# Patient Record
Sex: Female | Born: 1990
Health system: Southern US, Community
[De-identification: ages and names within clinical notes are randomized; demographics above are authoritative.]

## PROBLEM LIST (undated history)

## (undated) ENCOUNTER — Emergency Department (HOSPITAL_COMMUNITY): Discharge status: Absent without leave | Payer: BLUE CROSS/BLUE SHIELD

## (undated) DIAGNOSIS — Z3490 Encounter for supervision of normal pregnancy, unspecified, unspecified trimester: Secondary | ICD-10-CM

## (undated) DIAGNOSIS — O139 Gestational [pregnancy-induced] hypertension without significant proteinuria, unspecified trimester: Secondary | ICD-10-CM

## (undated) HISTORY — DX: Gestational (pregnancy-induced) hypertension without significant proteinuria, unspecified trimester: O13.9

## (undated) HISTORY — PX: NO PAST SURGERIES: SHX2092

---
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
## 2012-06-17 ENCOUNTER — Encounter (HOSPITAL_COMMUNITY): Payer: Self-pay | Admitting: *Deleted

## 2012-06-17 ENCOUNTER — Emergency Department (HOSPITAL_COMMUNITY)
Admission: EM | Admit: 2012-06-17 | Discharge: 2012-06-18 | Disposition: A | Discharge status: Home / Self Care | Payer: BC Managed Care – PPO | Attending: Emergency Medicine | Admitting: Emergency Medicine

## 2012-06-17 DIAGNOSIS — R21 Rash and other nonspecific skin eruption: Secondary | ICD-10-CM | POA: Insufficient documentation

## 2012-06-17 NOTE — ED Notes (Signed)
Rash to rt upper medial thigh.

## 2012-06-17 NOTE — ED Notes (Signed)
No answer

## 2012-06-18 MED ORDER — MUPIROCIN CALCIUM 2 % NA OINT: TOPICAL_OINTMENT | NASAL | Status: DC

## 2012-06-18 NOTE — ED Provider Notes (Signed)
History     CSN: [id number]  Arrival date & time 06/17/12  2159   First MD Initiated Contact with Patient 06/17/12 2354      Chief Complaint  Patient presents with  . Rash    (Consider location/radiation/quality/duration/timing/severity/associated sxs/prior treatment) HPI Comments: Patient states she first noticed a small abscess on the right upper inner thigh. This ruptured and shortly after this a old rash with small raised bumps in it appeared. The rash is uncomfortable with clothes rubbing against it. There is no weeping of this rash. The patient became concerned of possible infection and presents to the emergency department for evaluation. There's been no fever.  Patient is a 21 y.o. female presenting with rash. The history is provided by the patient.  Rash  This is a new problem. The current episode started more than 2 days ago. The problem has been gradually worsening. Associated with: unknown. There has been no fever. The rash is present on the right upper leg (right inner thigh). The pain is mild. The pain has been intermittent since onset. Associated symptoms include pain. She has tried nothing for the symptoms.    History reviewed. No pertinent past medical history.  History reviewed. No pertinent past surgical history.  History reviewed. No pertinent family history.  History  Substance Use Topics  . Smoking status: Never Smoker   . Smokeless tobacco: Not on file  . Alcohol Use: No    OB History    Grav Para Term Preterm Abortions TAB SAB Ect Mult Living                  Review of Systems  Constitutional: Negative for activity change.       All ROS Neg except as noted in HPI  HENT: Negative for nosebleeds and neck pain.   Eyes: Negative for photophobia and discharge.  Respiratory: Negative for cough, shortness of breath and wheezing.   Cardiovascular: Negative for chest pain and palpitations.  Gastrointestinal: Negative for abdominal pain and blood in  stool.  Genitourinary: Negative for dysuria, frequency and hematuria.  Musculoskeletal: Negative for back pain and arthralgias.  Skin: Positive for rash.  Neurological: Negative for dizziness, seizures and speech difficulty.  Psychiatric/Behavioral: Negative for hallucinations and confusion.    Allergies  Review of patient's allergies indicates no known allergies.  Home Medications   Current Outpatient Rx  Name Route Sig Dispense Refill  . MUPIROCIN CALCIUM 2 % NA OINT  Apply to thigh 2 times daily 5 g 0    BP 132/81  Pulse 81  Temp 98.4 F (36.9 C) (Oral)  Resp 16  Ht 5\' 7"  (1.702 m)  Wt 250 lb (113.399 kg)  BMI 39.16 kg/m2  SpO2 100%  LMP 05/29/2012  Physical Exam  Nursing note and vitals reviewed. Constitutional: She is oriented to person, place, and time. She appears well-developed and well-nourished.  Non-toxic appearance.  HENT:  Head: Normocephalic.  Right Ear: Tympanic membrane and external ear normal.  Left Ear: Tympanic membrane and external ear normal.  Eyes: EOM and lids are normal. Pupils are equal, round, and reactive to light.  Neck: Normal range of motion. Neck supple. Carotid bruit is not present.  Cardiovascular: Normal rate, regular rhythm, normal heart sounds, intact distal pulses and normal pulses.   Pulmonary/Chest: Breath sounds normal. No respiratory distress.  Abdominal: Soft. Bowel sounds are normal. There is no tenderness. There is no guarding.  Musculoskeletal: Normal range of motion.       There  is a healing abscess area of the right inner thigh. Just below this area there is an oval area of raised bumps. These are  mild to moderately tender to touch. There is no drainage. No red streaking appreciated.  Lymphadenopathy:       Head (right side): No submandibular adenopathy present.       Head (left side): No submandibular adenopathy present.    She has no cervical adenopathy.  Neurological: She is alert and oriented to person, place, and  time. She has normal strength. No cranial nerve deficit or sensory deficit.  Skin: Skin is warm and dry.  Psychiatric: She has a normal mood and affect. Her speech is normal.    ED Course  Procedures (including critical care time)  Labs Reviewed - No data to display No results found.   1. Rash       MDM  I have reviewed nursing notes, vital signs, and all appropriate lab and imaging results for this patient. It is suspected that the rash on the inner thigh it is either from the legs rubbing, or irritation from clothing, that later became infected. The patient will be treated with soaks, Bactroban ointment, and baby powder with cornstarch. The patient is to see her primary physician or return to the emergency department if not improving.       Kathie Dike, [place] 06/18/12 0045

## 2012-06-18 NOTE — ED Provider Notes (Signed)
Medical screening examination/treatment/procedure(s) were performed by non-physician practitioner and as supervising physician I was immediately available for consultation/collaboration.  Nicoletta Dress. Colon Branch, MD 06/18/12 [phone]

## 2012-08-24 ENCOUNTER — Encounter (HOSPITAL_COMMUNITY): Payer: Self-pay | Admitting: *Deleted

## 2012-08-24 ENCOUNTER — Emergency Department (HOSPITAL_COMMUNITY)
Admission: EM | Admit: 2012-08-24 | Discharge: 2012-08-24 | Disposition: A | Discharge status: Home / Self Care | Payer: BC Managed Care – PPO | Attending: Emergency Medicine | Admitting: Emergency Medicine

## 2012-08-24 DIAGNOSIS — K089 Disorder of teeth and supporting structures, unspecified: Secondary | ICD-10-CM | POA: Insufficient documentation

## 2012-08-24 DIAGNOSIS — K0889 Other specified disorders of teeth and supporting structures: Secondary | ICD-10-CM

## 2012-08-24 DIAGNOSIS — Z79899 Other long term (current) drug therapy: Secondary | ICD-10-CM | POA: Insufficient documentation

## 2012-08-24 LAB — OB RESULTS CONSOLE GC/CHLAMYDIA
Chlamydia: NEGATIVE
Gonorrhea: NEGATIVE

## 2012-08-24 LAB — OB RESULTS CONSOLE VARICELLA ZOSTER ANTIBODY, IGG: Varicella: IMMUNE

## 2012-08-24 LAB — OB RESULTS CONSOLE ABO/RH: RH Type: POSITIVE

## 2012-08-24 LAB — OB RESULTS CONSOLE HEPATITIS B SURFACE ANTIGEN: Hepatitis B Surface Ag: NEGATIVE

## 2012-08-24 LAB — CYSTIC FIBROSIS DIAGNOSTIC STUDY: Interpretation-CFDNA:: NEGATIVE

## 2012-08-24 LAB — OB RESULTS CONSOLE RUBELLA ANTIBODY, IGM: Rubella: UNDETERMINED

## 2012-08-24 LAB — OB RESULTS CONSOLE RPR: RPR: NONREACTIVE

## 2012-08-24 LAB — OB RESULTS CONSOLE ANTIBODY SCREEN: Antibody Screen: NEGATIVE

## 2012-08-24 LAB — OB RESULTS CONSOLE HIV ANTIBODY (ROUTINE TESTING): HIV: NONREACTIVE

## 2012-08-24 MED ORDER — HYDROCODONE-ACETAMINOPHEN 5-325 MG PO TABS: 1.0000 | ORAL_TABLET | ORAL | Status: AC | PRN

## 2012-08-24 MED ORDER — IBUPROFEN 800 MG PO TABS: 800.0000 mg | ORAL_TABLET | Freq: Once | ORAL | Status: DC

## 2012-08-24 MED ORDER — HYDROCODONE-ACETAMINOPHEN 5-325 MG PO TABS
1.0000 | ORAL_TABLET | Freq: Once | ORAL | Status: AC
  Administered 2012-08-24: 1 via ORAL
  Filled 2012-08-24: qty 1

## 2012-08-24 NOTE — ED Notes (Signed)
Pt reporting pain from tooth on right side of face, moving into neck. Reports pain began about 2 days ago.  Has not seen a dentist.

## 2012-08-26 NOTE — ED Provider Notes (Signed)
History     CSN: [id number]  Arrival date & time 08/24/12  0058   First MD Initiated Contact with Patient 08/24/12 0139      Chief Complaint  Patient presents with  . Dental Pain    (Consider location/radiation/quality/duration/timing/severity/associated sxs/prior treatment) HPI  Valerie Rasmussen is a 21 y.o. female who presents to the Emergency Department complaining of dental pain to a right upper molar that has had a filling previously that had fallen out. She has pain when she bites down and it she gets her tongue ring in the tooth. Denies fever, chills. She has taken no medicines.   History reviewed. No pertinent past medical history.  History reviewed. No pertinent past surgical history.  History reviewed. No pertinent family history.  History  Substance Use Topics  . Smoking status: Never Smoker   . Smokeless tobacco: Not on file  . Alcohol Use: No    OB History    Grav Para Term Preterm Abortions TAB SAB Ect Mult Living   1               Review of Systems  Constitutional: Negative for fever.       10 Systems reviewed and are negative for acute change except as noted in the HPI.  HENT: Positive for dental problem. Negative for congestion.   Eyes: Negative for discharge and redness.  Respiratory: Negative for cough and shortness of breath.   Cardiovascular: Negative for chest pain.  Gastrointestinal: Negative for vomiting and abdominal pain.  Musculoskeletal: Negative for back pain.  Skin: Negative for rash.  Neurological: Negative for syncope, numbness and headaches.  Psychiatric/Behavioral:       No behavior change.    Allergies  Review of patient's allergies indicates no known allergies.  Home Medications   Current Outpatient Rx  Name  Route  Sig  Dispense  Refill  . PRENATAL MULTIVITAMIN CH   Oral   Take 1 tablet by mouth daily.         Marland Kitchen HYDROCODONE-ACETAMINOPHEN 5-325 MG PO TABS   Oral   Take 1 tablet by mouth every 4 (four) hours as  needed for pain.   15 tablet   0   . MUPIROCIN CALCIUM 2 % NA OINT      Apply to thigh 2 times daily   5 g   0     BP 132/75  Pulse 90  Temp 98.5 F (36.9 C) (Oral)  Resp 16  Ht 5\' 9"  (1.753 m)  Wt 250 lb (113.399 kg)  BMI 36.92 kg/m2  SpO2 100%  LMP 05/29/2012  Physical Exam  Nursing note and vitals reviewed. Constitutional:       Awake, alert, nontoxic appearance.  HENT:  Head: Atraumatic.       Dentition fair. Right 1st molar upper jaw with cavity. No abscess.  Eyes: Right eye exhibits no discharge. Left eye exhibits no discharge.  Neck: Neck supple.  Pulmonary/Chest: Effort normal. She exhibits no tenderness.  Abdominal: Soft. There is no tenderness. There is no rebound.  Musculoskeletal: She exhibits no tenderness.       Baseline ROM, no obvious new focal weakness.  Neurological:       Mental status and motor strength appears baseline for patient and situation.  Skin: No rash noted.  Psychiatric: She has a normal mood and affect.    ED Course  Procedures (including critical care time)    1. Pain, dental       MDM  Patient with dental pain given analgesic with improvement. Pt stable in ED with no significant deterioration in condition.The patient appears reasonably screened and/or stabilized for discharge and I doubt any other medical condition or other [facility] requiring further screening, evaluation, or treatment in the ED at this time prior to discharge.  MDM Reviewed: nursing note and vitals           Nicoletta Dress. Colon Branch, MD 08/26/12 7829

## 2012-09-07 NOTE — L&D Delivery Note (Signed)
Delivery Note At 2:55 AM a viable female was delivered via Vaginal, Spontaneous Delivery (Presentation:vertex ; OA ).  APGAR: 8,9 ; weight pending .   Placenta status: intact, .  Cord:3 vessel  with the following complications:none .  Anesthesia:  Epidural  Episiotomy: none Lacerations: none Est. Blood Loss (mL): 200  Mom to postpartum.  Baby to nursery-stable.  Valerie Rasmussen, Valerie Rasmussen 03/18/2013, 3:09 AM

## 2012-09-07 NOTE — L&D Delivery Note (Signed)
I have seen and examined this patient and I agree with the above. Present for delivery. Cam Hai 3:14 AM 03/18/2013

## 2012-11-19 ENCOUNTER — Encounter: Payer: Self-pay | Admitting: *Deleted

## 2012-12-13 ENCOUNTER — Encounter: Payer: Self-pay | Admitting: Advanced Practice Midwife

## 2012-12-13 ENCOUNTER — Ambulatory Visit (INDEPENDENT_AMBULATORY_CARE_PROVIDER_SITE_OTHER): Payer: Medicaid Other | Admitting: Advanced Practice Midwife

## 2012-12-13 VITALS — BP 138/66 | Wt 280.6 lb

## 2012-12-13 DIAGNOSIS — O99320 Drug use complicating pregnancy, unspecified trimester: Secondary | ICD-10-CM | POA: Insufficient documentation

## 2012-12-13 DIAGNOSIS — O99210 Obesity complicating pregnancy, unspecified trimester: Secondary | ICD-10-CM

## 2012-12-13 DIAGNOSIS — O0990 Supervision of high risk pregnancy, unspecified, unspecified trimester: Secondary | ICD-10-CM | POA: Insufficient documentation

## 2012-12-13 DIAGNOSIS — O99322 Drug use complicating pregnancy, second trimester: Secondary | ICD-10-CM

## 2012-12-13 DIAGNOSIS — O99321 Drug use complicating pregnancy, first trimester: Secondary | ICD-10-CM | POA: Insufficient documentation

## 2012-12-13 DIAGNOSIS — F1920 Other psychoactive substance dependence, uncomplicated: Secondary | ICD-10-CM

## 2012-12-13 LAB — POCT URINALYSIS DIPSTICK
Blood, UA: NEGATIVE
Nitrite, UA: NEGATIVE
Protein, UA: NEGATIVE

## 2012-12-13 NOTE — Progress Notes (Signed)
No c/o at this time.  Routine questions about pregnancy answered.  F/U in 4 weeks for PN2 and LROB.  

## 2012-12-13 NOTE — Assessment & Plan Note (Signed)
Clinic:[facility]  Genetic Screen NT:                            First Screen:               Quad Screen/MSAFP: normal  Anatomic [place] normal  Glucose Screen   GBS   Feeding Preference   Contraception   Circumcision

## 2012-12-13 NOTE — Addendum Note (Signed)
Addended by: Jacklyn Shell on: 12/13/2012 12:50 PM   Modules accepted: Orders

## 2012-12-14 ENCOUNTER — Encounter: Payer: Self-pay | Admitting: Advanced Practice Midwife

## 2013-01-08 ENCOUNTER — Encounter (HOSPITAL_COMMUNITY): Payer: Self-pay

## 2013-01-08 ENCOUNTER — Emergency Department (HOSPITAL_COMMUNITY)
Admission: EM | Admit: 2013-01-08 | Discharge: 2013-01-08 | Disposition: A | Discharge status: Home / Self Care | Payer: Medicaid Other | Attending: Emergency Medicine | Admitting: Emergency Medicine

## 2013-01-08 DIAGNOSIS — O9989 Other specified diseases and conditions complicating pregnancy, childbirth and the puerperium: Secondary | ICD-10-CM | POA: Insufficient documentation

## 2013-01-08 DIAGNOSIS — N63 Unspecified lump in unspecified breast: Secondary | ICD-10-CM | POA: Insufficient documentation

## 2013-01-08 DIAGNOSIS — N6320 Unspecified lump in the left breast, unspecified quadrant: Secondary | ICD-10-CM

## 2013-01-08 DIAGNOSIS — Z872 Personal history of diseases of the skin and subcutaneous tissue: Secondary | ICD-10-CM | POA: Insufficient documentation

## 2013-01-08 DIAGNOSIS — R05 Cough: Secondary | ICD-10-CM | POA: Insufficient documentation

## 2013-01-08 DIAGNOSIS — R059 Cough, unspecified: Secondary | ICD-10-CM | POA: Insufficient documentation

## 2013-01-08 DIAGNOSIS — R209 Unspecified disturbances of skin sensation: Secondary | ICD-10-CM | POA: Insufficient documentation

## 2013-01-08 DIAGNOSIS — R509 Fever, unspecified: Secondary | ICD-10-CM | POA: Insufficient documentation

## 2013-01-08 HISTORY — DX: Encounter for supervision of normal pregnancy, unspecified, unspecified trimester: Z34.90

## 2013-01-08 NOTE — ED Notes (Signed)
Pt reports "knot to right breast" for 1 week.

## 2013-01-08 NOTE — ED Provider Notes (Signed)
History  This chart was scribed for Vida Roller, MD by Shari Heritage, ED Scribe. The patient was seen in room [facility]. Patient's care was started at 0902.   CSN: [id number]  Arrival date & time 01/08/13  0865   First MD Initiated Contact with Patient 01/08/13 [phone]      Chief Complaint  Patient presents with  . Breast Problem     The history is provided by the patient and a relative. No language interpreter was used.    HPI Comments: SASHA ROGEL is a 22 y.o. pregnant female who presents to the Emergency Department complaining of a non-tender "knot" to the superior lateral left breast. Patient states that she noticed this about 1 week ago. She denies a history of the same. She has no history of abscesses or skin infection. She denies leg swelling, difficulty urinating, diarrhea, nipple discharge or weakness. She states that she has occasional tingling in hands bilaterally, but this is not new. She also states that in the past week she has had intermittent subjective fever and cough, but this is improving. Patient is G1P0A0. She denies any complications with her pregnancy so far. Patient reports that her next follow up appointment with Dr. Emelda Fear is on Tuesday. She has a family history of breast cancer (maternal aunt and grandmother). Patient has no chronic medical conditions. She has never smoked.  No complications with her pregnancy - has had no htn, no swelling, no other sx.  Feels baby moving at baseline.  OB/GYN- Emelda Fear  Past Medical History  Diagnosis Date  . Pregnant     History reviewed. No pertinent past surgical history.  Family History  Problem Relation Age of Onset  . Hypertension Other   . Coronary artery disease Other     History  Substance Use Topics  . Smoking status: Never Smoker   . Smokeless tobacco: Not on file  . Alcohol Use: No    OB History   Grav Para Term Preterm Abortions TAB SAB Ect Mult Living   2               Review of  Systems A complete 10 system review of systems was obtained and all systems are negative except as noted in the HPI and PMH.   Allergies  Review of patient's allergies indicates no known allergies.  Home Medications   Current Outpatient Rx  Name  Route  Sig  Dispense  Refill  . Prenatal Vit-Fe Fumarate-FA (PRENATAL MULTIVITAMIN) TABS   Oral   Take 1 tablet by mouth daily.           Triage Vitals: BP 120/83  Pulse 88  Temp(Src) 98.4 F (36.9 C) (Oral)  Resp 18  Ht 5\' 6"  (1.676 m)  Wt 292 lb (132.45 kg)  BMI 47.15 kg/m2  SpO2 99%  LMP 06/11/2012  Physical Exam  Constitutional: She is oriented to person, place, and time. She appears well-developed and well-nourished. No distress.  HENT:  Head: Normocephalic and atraumatic.  Mouth/Throat: Uvula is midline, oropharynx is clear and moist and mucous membranes are normal. Mucous membranes are not dry. No oropharyngeal exudate, posterior oropharyngeal edema or posterior oropharyngeal erythema.  Eyes: Conjunctivae and EOM are normal. Pupils are equal, round, and reactive to light.  Neck: Normal range of motion. Neck supple.  Cardiovascular: Normal rate, regular rhythm and normal heart sounds.   Pulmonary/Chest: Effort normal and breath sounds normal. No respiratory distress. She has no wheezes. She has no rales.  Normal  appearing, large breasted. No asymmetry. No discharge from the nipples. No redness to the skin.  Musculoskeletal: Normal range of motion. She exhibits no edema.  Neurological: She is alert and oriented to person, place, and time.  Skin: Skin is warm and dry. No rash noted.  Psychiatric: She has a normal mood and affect. Her behavior is normal.    ED Course  Procedures (including critical care time) DIAGNOSTIC STUDIES: Oxygen Saturation is 99% on room air, normal by my interpretation.    COORDINATION OF CARE: 9:15 AM- Patient informed of current plan for treatment and evaluation and agrees with plan at this  time.      Labs Reviewed - No data to display No results found.   1. Breast mass, left       MDM  No discrete or tender masses were palpated, no redness to the skin, no discharge from the nipple, the patient has what appears to be normal appearing breasts at this time, blood pressure normal, no signs of preeclampsia, no complications with her pregnancy and has abated and the patient has followup within 48 hours for her routine OB/GYN visit with Dr. Emelda Fear. I have encouraged her to followup for repeat exam of her breasts and possible imaging at the discretion of her OB/GYN. She has expressed her understanding.   I personally performed the services described in this documentation, which was scribed in my presence. The recorded information has been reviewed and is accurate.      Vida Roller, MD 01/08/13 [phone]

## 2013-01-10 ENCOUNTER — Ambulatory Visit (INDEPENDENT_AMBULATORY_CARE_PROVIDER_SITE_OTHER): Payer: BC Managed Care – PPO | Admitting: Women's Health

## 2013-01-10 ENCOUNTER — Encounter: Payer: Self-pay | Admitting: Women's Health

## 2013-01-10 ENCOUNTER — Other Ambulatory Visit: Payer: BC Managed Care – PPO

## 2013-01-10 VITALS — BP 140/80 | Wt 277.8 lb

## 2013-01-10 DIAGNOSIS — I10 Essential (primary) hypertension: Secondary | ICD-10-CM

## 2013-01-10 DIAGNOSIS — O26849 Uterine size-date discrepancy, unspecified trimester: Secondary | ICD-10-CM

## 2013-01-10 DIAGNOSIS — IMO0001 Reserved for inherently not codable concepts without codable children: Secondary | ICD-10-CM

## 2013-01-10 DIAGNOSIS — Z3402 Encounter for supervision of normal first pregnancy, second trimester: Secondary | ICD-10-CM

## 2013-01-10 DIAGNOSIS — Z331 Pregnant state, incidental: Secondary | ICD-10-CM

## 2013-01-10 DIAGNOSIS — N6320 Unspecified lump in the left breast, unspecified quadrant: Secondary | ICD-10-CM | POA: Insufficient documentation

## 2013-01-10 DIAGNOSIS — O0993 Supervision of high risk pregnancy, unspecified, third trimester: Secondary | ICD-10-CM

## 2013-01-10 DIAGNOSIS — O9989 Other specified diseases and conditions complicating pregnancy, childbirth and the puerperium: Secondary | ICD-10-CM

## 2013-01-10 DIAGNOSIS — N63 Unspecified lump in unspecified breast: Secondary | ICD-10-CM

## 2013-01-10 DIAGNOSIS — F1920 Other psychoactive substance dependence, uncomplicated: Secondary | ICD-10-CM

## 2013-01-10 DIAGNOSIS — O99320 Drug use complicating pregnancy, unspecified trimester: Secondary | ICD-10-CM

## 2013-01-10 DIAGNOSIS — Z1389 Encounter for screening for other disorder: Secondary | ICD-10-CM

## 2013-01-10 LAB — POCT URINALYSIS DIPSTICK
Blood, UA: NEGATIVE
Glucose, UA: NEGATIVE
Nitrite, UA: NEGATIVE
Protein, UA: NEGATIVE

## 2013-01-10 LAB — CBC
MCH: 31.7 pg (ref 26.0–34.0)
MCHC: 33.8 g/dL (ref 30.0–36.0)
MCV: 93.8 fL (ref 78.0–100.0)
Platelets: 192 10*3/uL (ref 150–400)
RBC: 3.53 MIL/uL — ABNORMAL LOW (ref 3.87–5.11)

## 2013-01-10 NOTE — Progress Notes (Signed)
Reports good fm. Denies uc's, lof, vb, urinary frequency, urgency, hesitancy, or dysuria.  Denies ha, scotomata, ruq/epigastric pain, n/v.  Reports mass in Lt breast x 1 wk- went to [facility] on Sun for same.  Large pendulous breasts.  Mass palpated @ 2 o'clock 86fb from areola on Lt breast.  Co-exam w/ JGriffin, NP. Lt breast u/s scheduled for 01/18/13 @ 0900 @ [facility].  Initial bp at visit for preg test 150/84, since then 130s-140s/70s-80s w/ 140/80 today.  Denies h/o HTN.  Pre-gravid BMI 42. Will collect 24hr urine for baseline protein d/t probable CHTN, RN to recheck bp when returns w/ 24hr urine.  Reviewed ptl s/s, fetal kick counts, and pre-e s/s. All questions answered. 1 week for visit, f/u bp

## 2013-01-10 NOTE — Patient Instructions (Addendum)
Your breast ultrasound is scheduled for Wed 5/14 @ 9:00 @ Valerie Rasmussen.  Please arrive at 8:45.  Hypertension During Pregnancy Hypertension is also called high blood pressure. It can occur at any time in life and during pregnancy. When you have hypertension, there is extra pressure inside your blood vessels that carry blood from the heart to the rest of your body (arteries). Hypertension during pregnancy can cause problems for you and your baby. Your baby might not weigh as much as it should at birth or might be born early (premature). Very bad cases of hypertension during pregnancy can be life-threatening.  There are different types of hypertension during pregnancy.   Chronic hypertension. This happens when a woman has hypertension before pregnancy and it continues during pregnancy.  Gestational hypertension. This is when hypertension develops during pregnancy.  Preeclampsia or toxemia of pregnancy. This is a very serious type of hypertension that develops only during pregnancy. It is a disease that affects the whole body (systemic) and can be very dangerous for both mother and baby.  Gestational hypertension and preeclampsia usually go away after your baby is born. Blood pressure generally stabilizes within 6 weeks. Women who have hypertension during pregnancy have a greater chance of developing hypertension later in life or with future pregnancies. UNDERSTANDING BLOOD PRESSURE Blood pressure moves blood in your body. Sometimes, the force that moves the blood becomes too strong.  A blood pressure reading is given in 2 numbers and looks like a fraction.  The top number is called the systolic pressure. When your heart beats, it forces more blood to flow through the arteries. Pressure inside the arteries goes up.  The bottom number is the diastolic pressure. Pressure goes down between beats. That is when the heart is resting.  You may have hypertension if:  Your systolic blood pressure is above  140.  Your diastolic pressure is above 90. RISK FACTORS Some factors make you more likely to develop hypertension during pregnancy. Risk factors include:  Having hypertension before pregnancy.  Having hypertension during a previous pregnancy.  Being overweight.  Being older than 40.  Being pregnant with more than 1 baby (multiples).  Having diabetes or kidney problems. SYMPTOMS Chronic and gestational hypertension may not cause symptoms. Preeclampsia has symptoms, which may include:  Increased protein in your urine. Your caregiver will check for this at every prenatal visit.  Swelling of your hands and face.  Rapid weight gain.  Headaches.  Visual changes.  Being bothered by light.  Abdominal pain, especially in the right upper area.  Chest pain.  Shortness of breath.  Increased reflexes.  Seizures. Seizures occur with a more severe form of preeclampsia, called eclampsia. DIAGNOSIS   You may be diagnosed with hypertension during pregnancy during a regular prenatal exam. At each visit, tests may include:  Blood pressure checks.  A urine test to check for protein in your urine.  The type of hypertension you are diagnosed with depends on when you developed it. It also depends on your specific blood pressure reading.  Developing hypertension before 20 weeks of pregnancy is consistent with chronic hypertension.  Developing hypertension after 20 weeks of pregnancy is consistent with gestational hypertension.  Hypertension with increased urinary protein is diagnosed as preeclampsia.  Blood pressure measurements that stay above 160 systolic or 110 diastolic are a sign of severe preeclampsia. TREATMENT Treatment for hypertension during pregnancy varies. Treatment depends on the type of hypertension and how serious it is.  If you take medicine for  chronic hypertension, you may need to switch medicines.  Drugs called ACE inhibitors should not be taken during  pregnancy.  Low-dose aspirin may be suggested for women who have risk factors for preeclampsia.  If you have gestational hypertension, you may need to take a blood pressure medicine that is safe during pregnancy. Your caregiver will recommend the appropriate medicine.  If you have severe preeclampsia, you may need to be in the hospital. Caregivers will watch you and the baby very closely. You also may need to take medicine (magnesium sulfate) to prevent seizures and lower blood pressure.  Sometimes an early delivery is needed. This may be the case if the condition worsens. It would be done to protect you and the baby. The only cure for preeclampsia is delivery. HOME CARE INSTRUCTIONS  Schedule and keep all of your regular prenatal care.  Follow your caregiver's instructions for taking medicines. Tell your caregiver about all medicines you take. This includes over-the-counter medicines.  Eat as little salt as possible.  Get regular exercise.  Do not drink alcohol.  Do not use tobacco products.  Do not drink products with caffeine.  Lie on your left side when resting.  Tell your doctor if you have any preeclampsia symptoms. SEEK IMMEDIATE MEDICAL CARE IF:  You have severe abdominal pain.  You have sudden swelling in the hands, ankles, or face.  You gain 4 pounds (1.8 kg) or more in 1 week.  You vomit repeatedly.  You have vaginal bleeding.  You do not feel the baby moving as much.  You have a headache.  You have blurred or double vision.  You have muscle twitching or spasms.  You have shortness of breath.  You have blue fingernails and lips.  You have blood in your urine. MAKE SURE YOU:  Understand these instructions.  Will watch your condition.  Will get help right away if you are not doing well. Document Released: 05/12/2011 Document Revised: 11/16/2011 Document Reviewed: 05/12/2011 [facility] Patient Information 2013 [place], [place].

## 2013-01-11 LAB — DRUG SCREEN, URINE, NO CONFIRMATION
Benzodiazepines.: NEGATIVE
Creatinine,U: 77.1 mg/dL
Marijuana Metabolite: POSITIVE — AB
Methadone: NEGATIVE
Opiate Screen, Urine: NEGATIVE
Phencyclidine (PCP): NEGATIVE
Propoxyphene: NEGATIVE

## 2013-01-11 LAB — RPR

## 2013-01-11 LAB — HIV ANTIBODY (ROUTINE TESTING W REFLEX): HIV: NONREACTIVE

## 2013-01-11 LAB — OXYCODONE SCREEN, UA, RFLX CONFIRM: Oxycodone Screen, Ur: NEGATIVE ng/mL

## 2013-01-11 LAB — GLUCOSE TOLERANCE, 2 HOURS W/ 1HR: Glucose, 1 hour: 88 mg/dL (ref 70–170)

## 2013-01-12 ENCOUNTER — Other Ambulatory Visit: Payer: BC Managed Care – PPO

## 2013-01-12 DIAGNOSIS — O10012 Pre-existing essential hypertension complicating pregnancy, second trimester: Secondary | ICD-10-CM

## 2013-01-13 LAB — CREATININE CLEARANCE, URINE, 24 HOUR: Creatinine, Urine: 149.6 mg/dL

## 2013-01-13 LAB — PROTEIN, URINE, 24 HOUR
Protein, 24H Urine: 150 mg/d — ABNORMAL HIGH (ref 50–100)
Protein, Urine: 10 mg/dL

## 2013-01-14 ENCOUNTER — Encounter: Payer: Self-pay | Admitting: Women's Health

## 2013-01-16 ENCOUNTER — Ambulatory Visit (INDEPENDENT_AMBULATORY_CARE_PROVIDER_SITE_OTHER): Payer: BC Managed Care – PPO | Admitting: Obstetrics & Gynecology

## 2013-01-16 ENCOUNTER — Encounter: Payer: Self-pay | Admitting: Obstetrics & Gynecology

## 2013-01-16 VITALS — BP 120/70 | Wt 276.0 lb

## 2013-01-16 DIAGNOSIS — Z1389 Encounter for screening for other disorder: Secondary | ICD-10-CM

## 2013-01-16 DIAGNOSIS — Z331 Pregnant state, incidental: Secondary | ICD-10-CM

## 2013-01-16 DIAGNOSIS — F1920 Other psychoactive substance dependence, uncomplicated: Secondary | ICD-10-CM

## 2013-01-16 LAB — POCT URINALYSIS DIPSTICK
Blood, UA: NEGATIVE
Glucose, UA: NEGATIVE
Leukocytes, UA: NEGATIVE
Nitrite, UA: NEGATIVE

## 2013-01-16 NOTE — Progress Notes (Signed)
BP excellent Glucola normal BP weight and urine results all reviewed and noted. Patient reports good fetal movement, denies any bleeding and no rupture of membranes symptoms or regular contractions. Patient is without complaints. All questions were answered.

## 2013-01-18 ENCOUNTER — Ambulatory Visit (HOSPITAL_COMMUNITY)
Admission: RE | Admit: 2013-01-18 | Discharge: 2013-01-18 | Disposition: A | Discharge status: Home / Self Care | Payer: BC Managed Care – PPO | Source: Ambulatory Visit | Attending: Women's Health | Admitting: Women's Health

## 2013-01-18 DIAGNOSIS — N63 Unspecified lump in unspecified breast: Secondary | ICD-10-CM | POA: Insufficient documentation

## 2013-01-18 DIAGNOSIS — N6320 Unspecified lump in the left breast, unspecified quadrant: Secondary | ICD-10-CM

## 2013-01-18 DIAGNOSIS — O99891 Other specified diseases and conditions complicating pregnancy: Secondary | ICD-10-CM | POA: Insufficient documentation

## 2013-01-31 ENCOUNTER — Ambulatory Visit (INDEPENDENT_AMBULATORY_CARE_PROVIDER_SITE_OTHER): Payer: BC Managed Care – PPO | Admitting: Obstetrics & Gynecology

## 2013-01-31 VITALS — BP 130/84 | Wt 279.0 lb

## 2013-01-31 DIAGNOSIS — F1920 Other psychoactive substance dependence, uncomplicated: Secondary | ICD-10-CM

## 2013-01-31 DIAGNOSIS — Z1389 Encounter for screening for other disorder: Secondary | ICD-10-CM

## 2013-01-31 DIAGNOSIS — Z331 Pregnant state, incidental: Secondary | ICD-10-CM

## 2013-01-31 LAB — POCT URINALYSIS DIPSTICK
Blood, UA: NEGATIVE
Glucose, UA: NEGATIVE
Leukocytes, UA: NEGATIVE
Nitrite, UA: NEGATIVE

## 2013-01-31 NOTE — Progress Notes (Signed)
Start using a carpal tunnel brace. BP weight and urine results all reviewed and noted. Patient reports good fetal movement, denies any bleeding and no rupture of membranes symptoms or regular contractions. Patient is without complaints. All questions were answered.

## 2013-01-31 NOTE — Progress Notes (Signed)
Numbness and tingling in right hand and fingertips

## 2013-02-03 ENCOUNTER — Encounter (HOSPITAL_COMMUNITY): Payer: Self-pay

## 2013-02-03 ENCOUNTER — Emergency Department (HOSPITAL_COMMUNITY)
Admission: EM | Admit: 2013-02-03 | Discharge: 2013-02-03 | Disposition: A | Discharge status: Home / Self Care | Payer: BC Managed Care – PPO | Attending: Emergency Medicine | Admitting: Emergency Medicine

## 2013-02-03 DIAGNOSIS — K047 Periapical abscess without sinus: Secondary | ICD-10-CM | POA: Insufficient documentation

## 2013-02-03 DIAGNOSIS — K089 Disorder of teeth and supporting structures, unspecified: Secondary | ICD-10-CM | POA: Insufficient documentation

## 2013-02-03 DIAGNOSIS — Z79899 Other long term (current) drug therapy: Secondary | ICD-10-CM | POA: Insufficient documentation

## 2013-02-03 DIAGNOSIS — O9989 Other specified diseases and conditions complicating pregnancy, childbirth and the puerperium: Secondary | ICD-10-CM | POA: Insufficient documentation

## 2013-02-03 DIAGNOSIS — R000 Tachycardia, unspecified: Secondary | ICD-10-CM | POA: Insufficient documentation

## 2013-02-03 DIAGNOSIS — R599 Enlarged lymph nodes, unspecified: Secondary | ICD-10-CM | POA: Insufficient documentation

## 2013-02-03 MED ORDER — AMOXICILLIN 500 MG PO CAPS: 500.0000 mg | ORAL_CAPSULE | Freq: Three times a day (TID) | ORAL | Status: DC

## 2013-02-03 NOTE — ED Provider Notes (Signed)
History     CSN: [id number]  Arrival date & time 02/03/13  2028   First MD Initiated Contact with Patient 02/03/13 2035      Chief Complaint  Patient presents with  . Dental Pain    (Consider location/radiation/quality/duration/timing/severity/associated sxs/prior treatment) HPI Valerie Rasmussen is a 22 y.o. female who is 6 months pregnant and presents to the ED with a painful tooth on the lower left. The pain started a couple days ago and has gotten worse. Today she has gland swelling on the left side of her neck. The history was provided by the patient.  Past Medical History  Diagnosis Date  . Pregnant     Past Surgical History  Procedure Laterality Date  . No past surgeries      Family History  Problem Relation Age of Onset  . Hypertension Other   . Coronary artery disease Other   . Cancer Other   . Heart disease Other   . Glaucoma Mother   . Heart attack Father   . Cancer Maternal Aunt     breast  . Diabetes Maternal Uncle   . Heart attack Paternal Uncle     History  Substance Use Topics  . Smoking status: Never Smoker   . Smokeless tobacco: Not on file  . Alcohol Use: No    OB History   Grav Para Term Preterm Abortions TAB SAB Ect Mult Living   1               Review of Systems  Constitutional: Negative for fever and appetite change.  HENT: Positive for dental problem. Negative for sore throat and neck pain.   Skin: Negative for rash.  Neurological: Negative for headaches.  Psychiatric/Behavioral: The patient is not nervous/anxious.     Allergies  Review of patient's allergies indicates no known allergies.  Home Medications   Current Outpatient Rx  Name  Route  Sig  Dispense  Refill  . amoxicillin (AMOXIL) 500 MG capsule   Oral   Take 1 capsule (500 mg total) by mouth 3 (three) times daily.   21 capsule   0   . Prenatal Vit-Fe Fumarate-FA (PRENATAL MULTIVITAMIN) TABS   Oral   Take 1 tablet by mouth daily.           BP 130/83   Pulse 101  Temp(Src) 99.3 F (37.4 C) (Oral)  Resp 16  SpO2 100%  LMP 06/11/2012  Physical Exam  Nursing note and vitals reviewed. Constitutional: She is oriented to person, place, and time. She appears well-developed and well-nourished. No distress.  HENT:  Head: Normocephalic.  Mouth/Throat: Uvula is midline, oropharynx is clear and moist and mucous membranes are normal.    abscess  Eyes: EOM are normal.  Neck: Neck supple.  Cardiovascular: Tachycardia present.   Pulmonary/Chest: Effort normal.  Abdominal: Soft.  FHT's doppler 143  Musculoskeletal: Normal range of motion.  Lymphadenopathy:    She has cervical adenopathy (left).  Neurological: She is alert and oriented to person, place, and time. No cranial nerve deficit.  Skin: Skin is warm and dry.  Psychiatric: She has a normal mood and affect. Her behavior is normal. Judgment and thought content normal.    ED Course  Procedures (including critical care time)   1. Dental abscess     MDM  22 y.o. female with dental pain and abscess. Will treat with antibiotics and she will take tylenol for pain and follow up with her dentist ASAP. She has a  follow up appointment next week with her OB.  Discussed with the patient clinical findings and plan of care. All questioned fully answered. She will return if any problems arise.    Medication List    TAKE these medications       amoxicillin 500 MG capsule  Commonly known as:  AMOXIL  Take 1 capsule (500 mg total) by mouth 3 (three) times daily.      ASK your doctor about these medications       prenatal multivitamin Tabs  Take 1 tablet by mouth daily.               [place], [place] 02/03/13 [phone]

## 2013-02-03 NOTE — ED Notes (Signed)
C/o left lower ? Dental abscess. Pt 6 months pregnant as well. Denies any pregnancy related issues.

## 2013-02-03 NOTE — ED Notes (Signed)
nad noted prior to dc. Dc instructions reviewed. Dentist f/u instructed along with OB f/u. Voiced understanding. 1 Rx given to pt prior to dc.

## 2013-02-08 NOTE — ED Provider Notes (Signed)
Medical screening examination/treatment/procedure(s) were performed by non-physician practitioner and as supervising physician I was immediately available for consultation/collaboration.   Camden Mazzaferro W. Lenora Gomes, MD 02/08/13 0908 

## 2013-02-15 ENCOUNTER — Encounter: Payer: Self-pay | Admitting: Obstetrics and Gynecology

## 2013-02-15 ENCOUNTER — Ambulatory Visit (INDEPENDENT_AMBULATORY_CARE_PROVIDER_SITE_OTHER): Payer: BC Managed Care – PPO | Admitting: Obstetrics and Gynecology

## 2013-02-15 VITALS — BP 120/80 | Wt 282.0 lb

## 2013-02-15 DIAGNOSIS — Z1389 Encounter for screening for other disorder: Secondary | ICD-10-CM

## 2013-02-15 DIAGNOSIS — Z3493 Encounter for supervision of normal pregnancy, unspecified, third trimester: Secondary | ICD-10-CM

## 2013-02-15 DIAGNOSIS — O99320 Drug use complicating pregnancy, unspecified trimester: Secondary | ICD-10-CM

## 2013-02-15 DIAGNOSIS — O99019 Anemia complicating pregnancy, unspecified trimester: Secondary | ICD-10-CM

## 2013-02-15 LAB — POCT URINALYSIS DIPSTICK
Glucose, UA: NEGATIVE
Leukocytes, UA: NEGATIVE
Nitrite, UA: NEGATIVE

## 2013-02-15 NOTE — Progress Notes (Signed)
Pt here today for routine visit. Pt states she is having a little pressure in her lower abdominal area when the baby moves. Pt denies any other issues at this time. Bottle fdg,, some consideration of breast x 4 wk.  Stable pnc

## 2013-03-01 ENCOUNTER — Encounter: Payer: Self-pay | Admitting: Advanced Practice Midwife

## 2013-03-01 ENCOUNTER — Ambulatory Visit (INDEPENDENT_AMBULATORY_CARE_PROVIDER_SITE_OTHER): Payer: BC Managed Care – PPO | Admitting: Advanced Practice Midwife

## 2013-03-01 VITALS — BP 142/80 | Wt 286.0 lb

## 2013-03-01 DIAGNOSIS — O99019 Anemia complicating pregnancy, unspecified trimester: Secondary | ICD-10-CM

## 2013-03-01 DIAGNOSIS — I10 Essential (primary) hypertension: Secondary | ICD-10-CM

## 2013-03-01 DIAGNOSIS — Z331 Pregnant state, incidental: Secondary | ICD-10-CM

## 2013-03-01 DIAGNOSIS — Z1389 Encounter for screening for other disorder: Secondary | ICD-10-CM

## 2013-03-01 DIAGNOSIS — O0993 Supervision of high risk pregnancy, unspecified, third trimester: Secondary | ICD-10-CM

## 2013-03-01 DIAGNOSIS — F1920 Other psychoactive substance dependence, uncomplicated: Secondary | ICD-10-CM

## 2013-03-01 LAB — POCT URINALYSIS DIPSTICK
Ketones, UA: NEGATIVE
Leukocytes, UA: NEGATIVE

## 2013-03-01 NOTE — Addendum Note (Signed)
Addended by: Colen Darling on: 03/01/2013 02:46 PM   Modules accepted: Orders

## 2013-03-01 NOTE — Progress Notes (Signed)
Feeling some pressure

## 2013-03-01 NOTE — Progress Notes (Signed)
No c/o at this time.  Routine questions about pregnancy answered.  F/U in 1 weeks for LROB/EFW/AFI (CHTN, no meds)

## 2013-03-08 ENCOUNTER — Encounter: Payer: Self-pay | Admitting: Obstetrics and Gynecology

## 2013-03-08 ENCOUNTER — Ambulatory Visit: Payer: BC Managed Care – PPO

## 2013-03-08 ENCOUNTER — Ambulatory Visit (INDEPENDENT_AMBULATORY_CARE_PROVIDER_SITE_OTHER): Payer: BC Managed Care – PPO | Admitting: Obstetrics and Gynecology

## 2013-03-08 VITALS — BP 122/80 | Wt 285.0 lb

## 2013-03-08 DIAGNOSIS — F1920 Other psychoactive substance dependence, uncomplicated: Secondary | ICD-10-CM

## 2013-03-08 DIAGNOSIS — O99320 Drug use complicating pregnancy, unspecified trimester: Secondary | ICD-10-CM

## 2013-03-08 DIAGNOSIS — E669 Obesity, unspecified: Secondary | ICD-10-CM

## 2013-03-08 DIAGNOSIS — O99019 Anemia complicating pregnancy, unspecified trimester: Secondary | ICD-10-CM

## 2013-03-08 DIAGNOSIS — O99210 Obesity complicating pregnancy, unspecified trimester: Secondary | ICD-10-CM

## 2013-03-08 DIAGNOSIS — Z1389 Encounter for screening for other disorder: Secondary | ICD-10-CM

## 2013-03-08 DIAGNOSIS — N6320 Unspecified lump in the left breast, unspecified quadrant: Secondary | ICD-10-CM

## 2013-03-08 DIAGNOSIS — O139 Gestational [pregnancy-induced] hypertension without significant proteinuria, unspecified trimester: Secondary | ICD-10-CM

## 2013-03-08 DIAGNOSIS — Z3493 Encounter for supervision of normal pregnancy, unspecified, third trimester: Secondary | ICD-10-CM

## 2013-03-08 LAB — POCT URINALYSIS DIPSTICK
Blood, UA: NEGATIVE
Leukocytes, UA: NEGATIVE
Nitrite, UA: NEGATIVE
Protein, UA: 1

## 2013-03-08 NOTE — Patient Instructions (Signed)
Hypertension During Pregnancy Hypertension is also called high blood pressure. It can occur at any time in life and during pregnancy. When you have hypertension, there is extra pressure inside your blood vessels that carry blood from the heart to the rest of your body (arteries). Hypertension during pregnancy can cause problems for you and your baby. Your baby might not weigh as much as it should at birth or might be born early (premature). Very bad cases of hypertension during pregnancy can be life-threatening.  There are different types of hypertension during pregnancy.   Chronic hypertension. This happens when a woman has hypertension before pregnancy and it continues during pregnancy.  Gestational hypertension. This is when hypertension develops during pregnancy. Preeclampsia or toxemia of pregnancy. This is a very serious type of hypertePreeclampsia and Eclampsia Preeclampsia is a condition of high blood pressure during pregnancy. It can happen at 20 weeks or later in pregnancy. If high blood pressure occurs in the second half of pregnancy with no other symptoms, it is called gestational hypertension and goes away after the baby is born. If any of the symptoms listed below develop with gestational hypertension, it is then called preeclampsia. Eclampsia (convulsions) may follow preeclampsia. This is one of the reasons for regular prenatal checkups. Early diagnosis and treatment are very important to prevent eclampsia. CAUSES  There is no known cause of preeclampsia/eclampsia in pregnancy. There are several known conditions that may put the pregnant woman at risk, such as: The first pregnancy. Having preeclampsia in a past pregnancy. Having lasting (chronic) high blood pressure. Having multiples (twins, triplets). Being age 63 or older. African American ethnic background. Having kidney disease or diabetes. Medical conditions such as lupus or blood diseases. Being overweight (obese). SYMPTOMS   High blood pressure. Headaches. Sudden weight gain. Swelling of hands, face, legs, and feet. Protein in the urine. Feeling sick to your stomach (nauseous) and throwing up (vomiting). Vision problems (blurred or double vision). Numbness in the face, arms, legs, and feet. Dizziness. Slurred speech. Preeclampsia can cause growth retardation in the fetus. Separation (abruption) of the placenta. Not enough fluid in the amniotic sac (oligohydramnios). Sensitivity to bright lights. Belly (abdominal) pain. DIAGNOSIS  If protein is found in the urine in the second half of pregnancy, this is considered preeclampsia. Other symptoms mentioned above may also be present. TREATMENT  It is necessary to treat this. Your caregiver may prescribe bed rest early in this condition. Plenty of rest and salt restriction may be all that is needed. Medicines may be necessary to lower blood pressure if the condition does not respond to more conservative measures. In more severe cases, hospitalization may be needed: For treatment of blood pressure. To control fluid retention. To monitor the baby to see if the condition is causing harm to the baby. Hospitalization is the best way to treat the first sign of preeclampsia. This is so the mother and baby can be watched closely and blood tests can be done effectively and correctly. If the condition becomes severe, it may be necessary to induce labor or to remove the infant by surgical means (cesarean section). The best cure for preeclampsia/eclampsia is to deliver the baby. Preeclampsia and eclampsia involve risks to mother and infant. Your caregiver will discuss these risks with you. Together, you can work out the best possible approach to your problems. Make sure you keep your prenatal visits as scheduled. Not keeping appointments could result in a chronic or permanent injury, pain, disability to you, and death or injury  to you or your unborn baby. If there is any  problem keeping the appointment, you must call to reschedule. HOME CARE INSTRUCTIONS  Keep your prenatal appointments and tests as scheduled. Tell your caregiver if you have any of the above risk factors. Get plenty of rest and sleep. Eat a balanced diet that is low in salt, and do not add salt to your food. Avoid stressful situations. Only take over-the-counter and prescriptions medicines for pain, discomfort, or fever as directed by your caregiver. SEEK IMMEDIATE MEDICAL CARE IF:  You develop severe swelling anywhere in the body. This usually occurs in the legs. You gain 5 lb/2.3 kg or more in a week. You develop a severe headache, dizziness, problems with your vision, or confusion. You have abdominal pain, nausea, or vomiting. You have a seizure. You have trouble moving any part of your body, or you develop numbness or problems speaking. You have bruising or abnormal bleeding from anywhere in the body. You develop a stiff neck. You pass out. MAKE SURE YOU:  Understand these instructions. Will watch your condition. Will get help right away if you are not doing well or get worse. Document Released: 08/21/2000 Document Revised: 11/16/2011 Document Reviewed: 04/06/2008 [facility] Patient Information 2014 [place], [place].  nsion that develops only during pregnancy. It is a disease that affects the whole body (systemic) and can be very dangerous for both mother and baby.  Gestational hypertension and preeclampsia usually go away after your baby is born. Blood pressure generally stabilizes within 6 weeks. Women who have hypertension during pregnancy have a greater chance of developing hypertension later in life or with future pregnancies. UNDERSTANDING BLOOD PRESSURE Blood pressure moves blood in your body. Sometimes, the force that moves the blood becomes too strong.  A blood pressure reading is given in 2 numbers and looks like a fraction.  The top number is called the systolic pressure.  When your heart beats, it forces more blood to flow through the arteries. Pressure inside the arteries goes up.  The bottom number is the diastolic pressure. Pressure goes down between beats. That is when the heart is resting.  You may have hypertension if:  Your systolic blood pressure is above 140.  Your diastolic pressure is above 90. RISK FACTORS Some factors make you more likely to develop hypertension during pregnancy. Risk factors include:  Having hypertension before pregnancy.  Having hypertension during a previous pregnancy.  Being overweight.  Being older than 40.  Being pregnant with more than 1 baby (multiples).  Having diabetes or kidney problems. SYMPTOMS Chronic and gestational hypertension may not cause symptoms. Preeclampsia has symptoms, which may include:  Increased protein in your urine. Your caregiver will check for this at every prenatal visit.  Swelling of your hands and face.  Rapid weight gain.  Headaches.  Visual changes.  Being bothered by light.  Abdominal pain, especially in the right upper area.  Chest pain.  Shortness of breath.  Increased reflexes.  Seizures. Seizures occur with a more severe form of preeclampsia, called eclampsia. DIAGNOSIS   You may be diagnosed with hypertension during pregnancy during a regular prenatal exam. At each visit, tests may include:  Blood pressure checks.  A urine test to check for protein in your urine.  The type of hypertension you are diagnosed with depends on when you developed it. It also depends on your specific blood pressure reading.  Developing hypertension before 20 weeks of pregnancy is consistent with chronic hypertension.  Developing hypertension after 20  weeks of pregnancy is consistent with gestational hypertension.  Hypertension with increased urinary protein is diagnosed as preeclampsia.  Blood pressure measurements that stay above 160 systolic or 110 diastolic are a sign of  severe preeclampsia. TREATMENT Treatment for hypertension during pregnancy varies. Treatment depends on the type of hypertension and how serious it is.  If you take medicine for chronic hypertension, you may need to switch medicines.  Drugs called ACE inhibitors should not be taken during pregnancy.  Low-dose aspirin may be suggested for women who have risk factors for preeclampsia.  If you have gestational hypertension, you may need to take a blood pressure medicine that is safe during pregnancy. Your caregiver will recommend the appropriate medicine.  If you have severe preeclampsia, you may need to be in the hospital. Caregivers will watch you and the baby very closely. You also may need to take medicine (magnesium sulfate) to prevent seizures and lower blood pressure.  Sometimes an early delivery is needed. This may be the case if the condition worsens. It would be done to protect you and the baby. The only cure for preeclampsia is delivery. HOME CARE INSTRUCTIONS  Schedule and keep all of your regular prenatal care.  Follow your caregiver's instructions for taking medicines. Tell your caregiver about all medicines you take. This includes over-the-counter medicines.  Eat as little salt as possible.  Get regular exercise.  Do not drink alcohol.  Do not use tobacco products.  Do not drink products with caffeine.  Lie on your left side when resting.  Tell your doctor if you have any preeclampsia symptoms. SEEK IMMEDIATE MEDICAL CARE IF:  You have severe abdominal pain.  You have sudden swelling in the hands, ankles, or face.  You gain 4 pounds (1.8 kg) or more in 1 week.  You vomit repeatedly.  You have vaginal bleeding.  You do not feel the baby moving as much.  You have a headache.  You have blurred or double vision.  You have muscle twitching or spasms.  You have shortness of breath.  You have blue fingernails and lips.  You have blood in your  urine. MAKE SURE YOU:  Understand these instructions.  Will watch your condition.  Will get help right away if you are not doing well. Document Released: 05/12/2011 Document Revised: 11/16/2011 Document Reviewed: 05/12/2011 [facility] Patient Information 2014 [place], [place].

## 2013-03-08 NOTE — Progress Notes (Signed)
Pt here today for routine visit. Pt states she is having some pressure at night. Pt states she has noticed some lower back pain lately. Pt denies any other issues.  Denies h/a , scotoma, ruq pain,  Considering classes 7/19

## 2013-03-17 ENCOUNTER — Other Ambulatory Visit: Payer: BC Managed Care – PPO

## 2013-03-17 ENCOUNTER — Inpatient Hospital Stay (HOSPITAL_COMMUNITY)
Admission: EM | Admit: 2013-03-17 | Discharge: 2013-03-20 | DRG: 372 | Disposition: A | Discharge status: Home / Self Care | Payer: BC Managed Care – PPO | Attending: Obstetrics & Gynecology | Admitting: Obstetrics & Gynecology

## 2013-03-17 ENCOUNTER — Encounter (HOSPITAL_COMMUNITY): Payer: Self-pay

## 2013-03-17 ENCOUNTER — Inpatient Hospital Stay (HOSPITAL_COMMUNITY): Payer: BC Managed Care – PPO

## 2013-03-17 ENCOUNTER — Encounter: Payer: BC Managed Care – PPO | Admitting: Obstetrics & Gynecology

## 2013-03-17 DIAGNOSIS — O139 Gestational [pregnancy-induced] hypertension without significant proteinuria, unspecified trimester: Principal | ICD-10-CM | POA: Diagnosis present

## 2013-03-17 DIAGNOSIS — O4100X0 Oligohydramnios, unspecified trimester, not applicable or unspecified: Secondary | ICD-10-CM

## 2013-03-17 DIAGNOSIS — O163 Unspecified maternal hypertension, third trimester: Secondary | ICD-10-CM

## 2013-03-17 LAB — COMPREHENSIVE METABOLIC PANEL
ALT: 27 U/L (ref 0–35)
AST: 21 U/L (ref 0–37)
Albumin: 3.1 g/dL — ABNORMAL LOW (ref 3.5–5.2)
Alkaline Phosphatase: 122 U/L — ABNORMAL HIGH (ref 39–117)
CO2: 22 mEq/L (ref 19–32)
Chloride: 103 mEq/L (ref 96–112)
GFR calc non Af Amer: >90 mL/min (ref >90)
Potassium: 3.8 mEq/L (ref 3.5–5.1)
Total Bilirubin: 0.5 mg/dL (ref 0.3–1.2)

## 2013-03-17 LAB — URINALYSIS, ROUTINE W REFLEX MICROSCOPIC
Bilirubin Urine: NEGATIVE
Hgb urine dipstick: NEGATIVE
Ketones, ur: 40 mg/dL — AB
Nitrite: NEGATIVE
Specific Gravity, Urine: 1.015 (ref 1.005–1.030)
pH: 6.5 (ref 5.0–8.0)

## 2013-03-17 LAB — CBC
MCH: 32.2 pg (ref 26.0–34.0)
MCHC: 35.5 g/dL (ref 30.0–36.0)
MCV: 90.8 fL (ref 78.0–100.0)
Platelets: 175 10*3/uL (ref 150–400)
RDW: 13 % (ref 11.5–15.5)

## 2013-03-17 LAB — PROTEIN / CREATININE RATIO, URINE: Creatinine, Urine: 224.04 mg/dL

## 2013-03-17 LAB — RPR: RPR Ser Ql: NONREACTIVE

## 2013-03-17 LAB — URINE MICROSCOPIC-ADD ON

## 2013-03-17 IMAGING — US US OB COMP +14 WK
1 series · 12 of 28 positions shown · non-contrast
Comparison: none

[Series 1: us ob follow up · 12 of 45 slices shown]
[im 2/45]
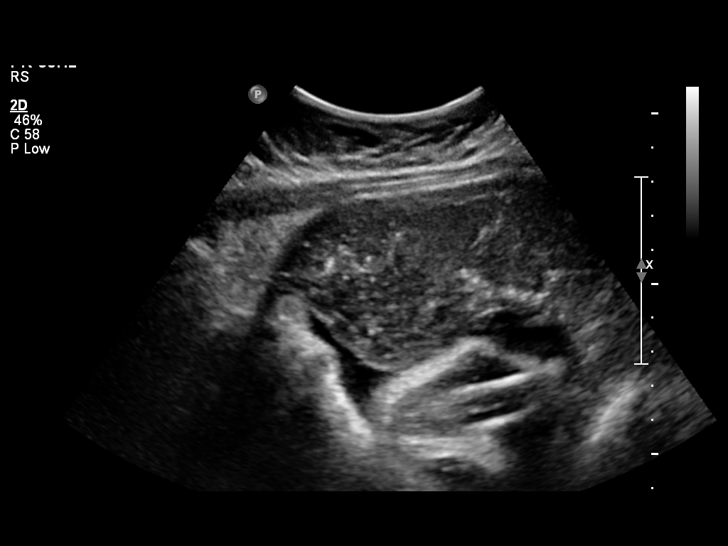
[im 5/45]
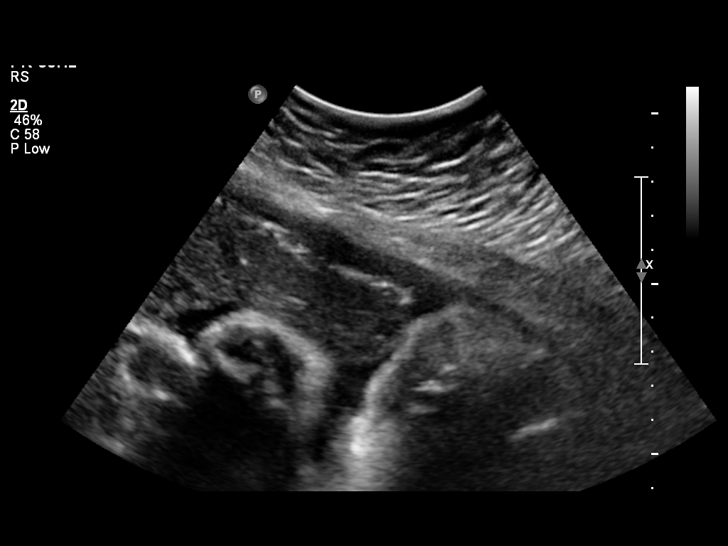
[im 9/45]
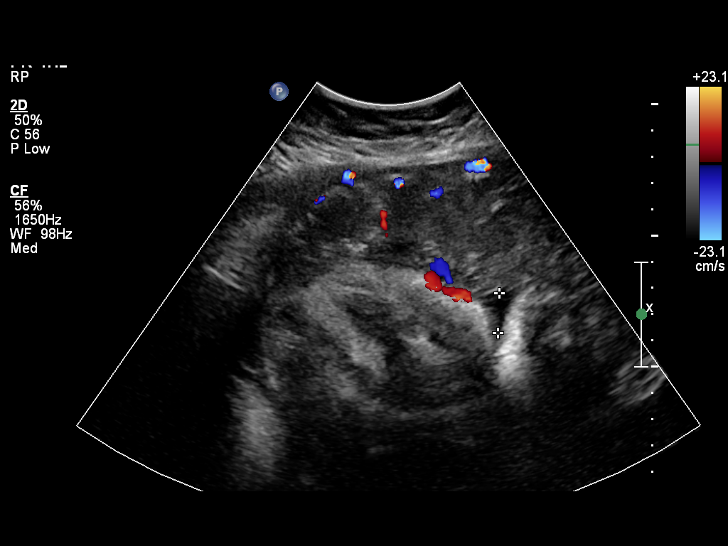
[im 14/45]
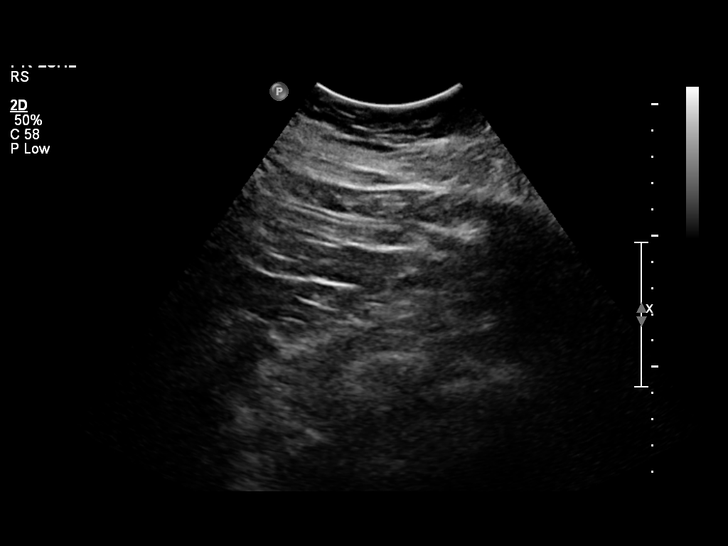
[im 17/45]
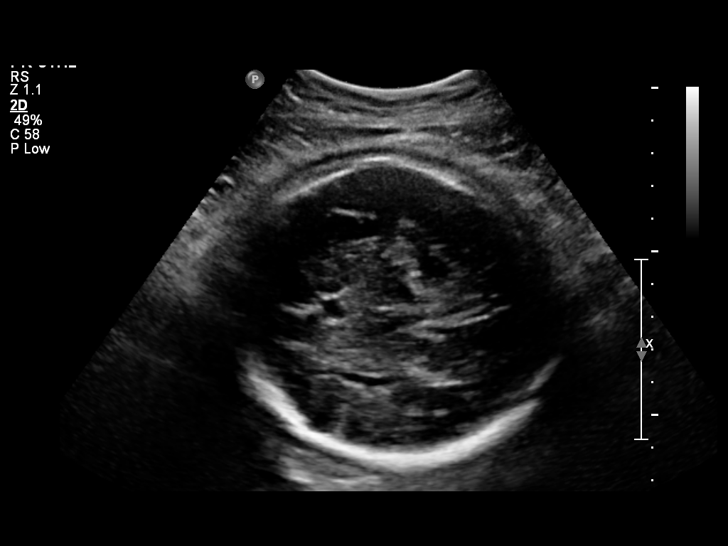
[im 20/45]
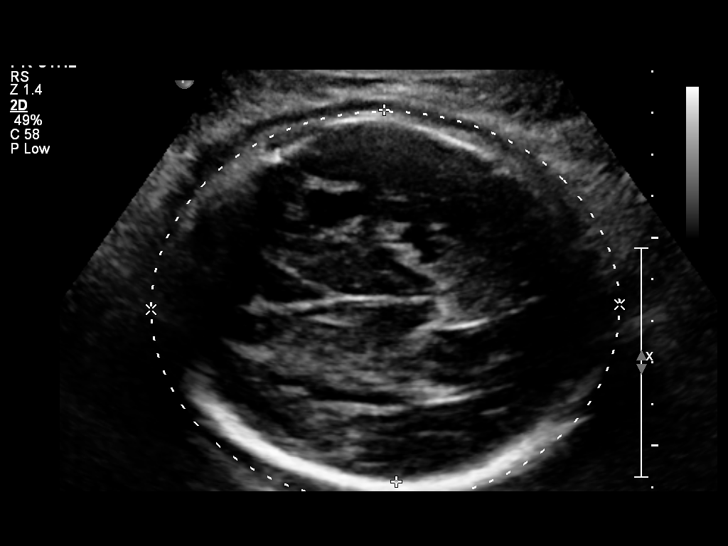
[im 25/45]
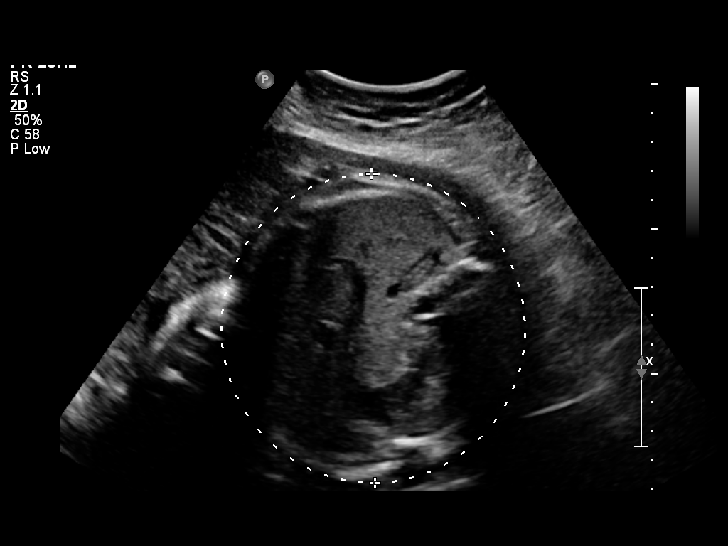
[im 28/45]
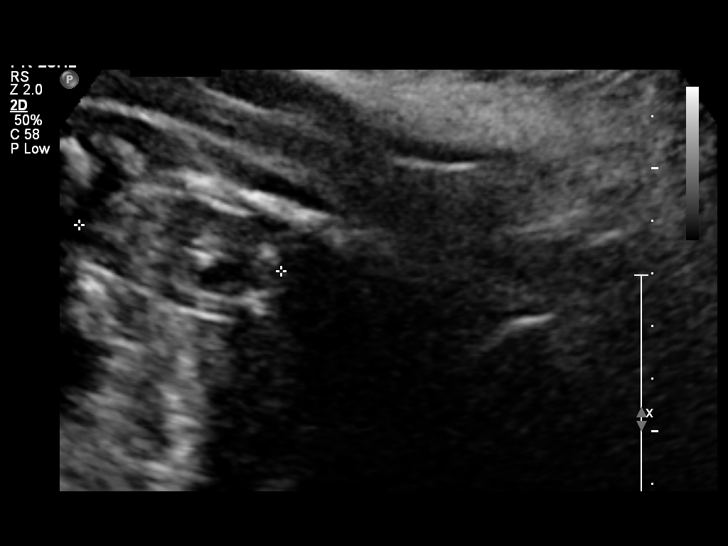
[im 31/45]
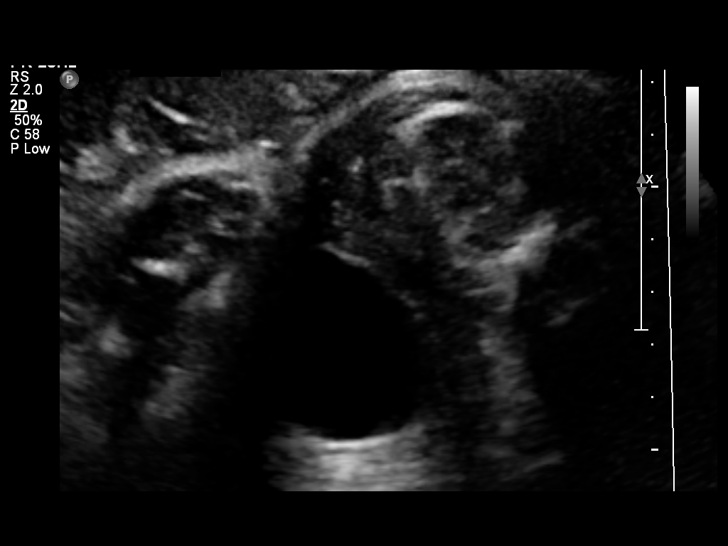
[im 36/45]
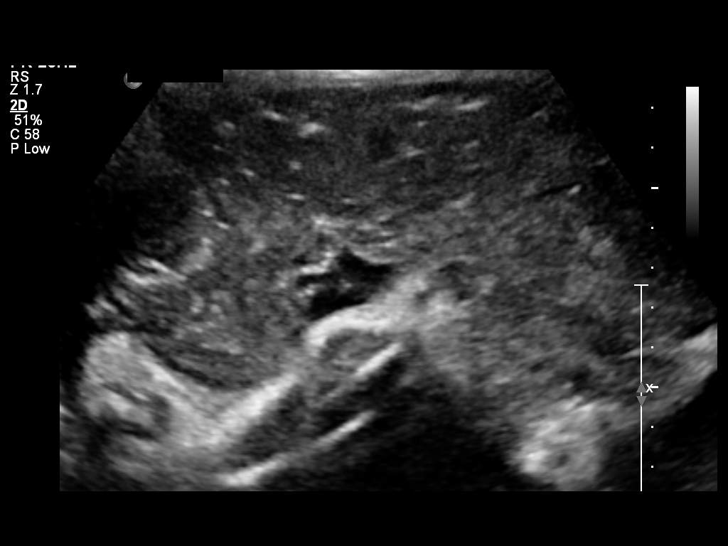
[im 40/45]
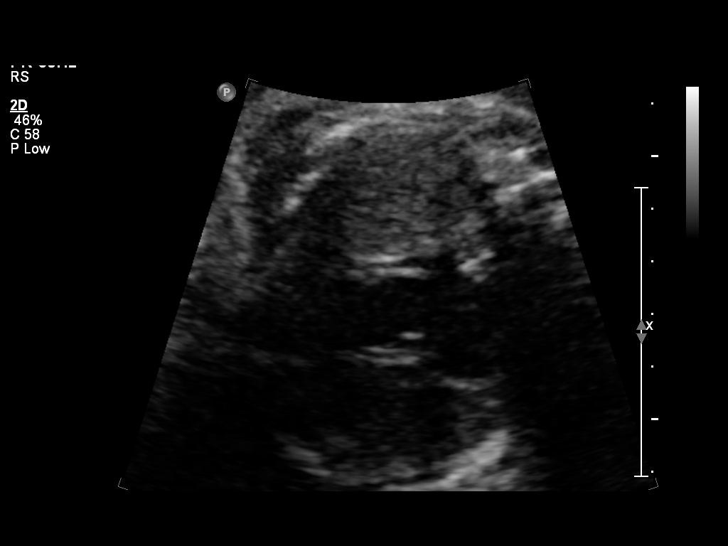
[im 43/45]
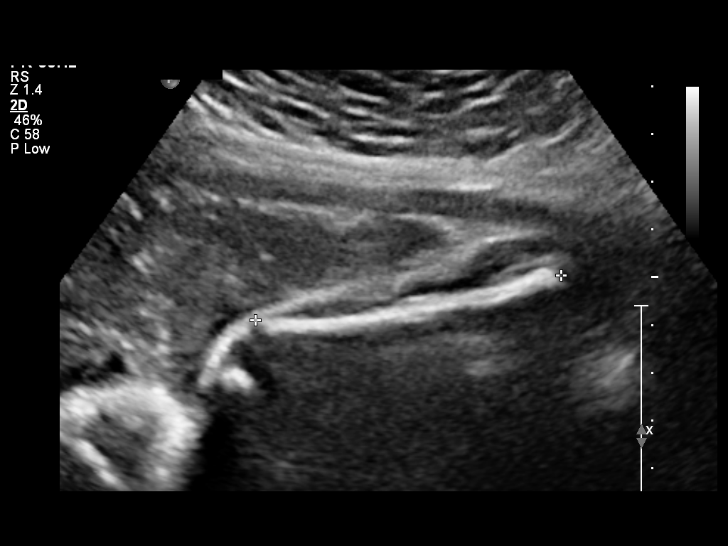

[12 of 28 positions shown; findings below may reference images not displayed]

OBSTETRICS REPORT
                      (Signed Final [DATE] [DATE])

 Name:       PAT             Visit Date:  [DATE] [DATE]

Service(s) Provided

 US OB COMP + 14 WK                                    76805.1
Indications

 Hypertension - Gestational
 Basic anatomic survey                                 [V0]
Fetal Evaluation

 Num Of Fetuses:    1
 Fetal Heart Rate:  134                         bpm
 Cardiac Activity:  Observed
 Presentation:      Cephalic
 Placenta:          Anterior, above cervical os
 P. Cord            Not well visualized
 Insertion:

 Amniotic Fluid
 AFI FV:      Subjectively decreased
 AFI Sum:     8.83    cm      16   %Tile     Larg Pckt:   3.29   cm
 RUQ:   1.51   cm    RLQ:    3.29   cm    LUQ:   0.84    cm   LLQ:    3.19   cm
Biometry

 BPD:     89.5  mm    G. Age:   36w 2d                CI:        75.15   70 - 86
                                                      FL/HC:      22.0   20.8 -

 HC:     327.5  mm    G. Age:   37w 1d       22  %    HC/AC:      0.99   0.92 -

 AC:     331.2  mm    G. Age:   37w 0d       54  %    FL/BPD:     80.4   71 - 87
 FL:        72  mm    G. Age:   36w 6d       37  %    FL/AC:      21.7   20 - 24
 HUM:     64.1  mm    G. Age:   37w 1d       69  %

 Est. FW:    [V0]  gm    6 lb 12 oz      61  %
Gestational Age

 LMP:           39w 6d       Date:   [DATE]                 EDD:   [DATE]
 Clinical EDD:  37w 3d                                        EDD:   [DATE]
 U/S Today:     36w 6d                                        EDD:   [DATE]
 Best:          37w 3d    Det. By:   Clinical EDD             EDD:   [DATE]
Anatomy
 Cranium:          Appears normal         Aortic Arch:      Basic anatomy
                                                            exam per order
 Fetal Cavum:      Not well visualized    Ductal Arch:      Basic anatomy
                                                            exam per order
 Ventricles:       Appears normal         Diaphragm:        Basic anatomy
                                                            exam per order
 Choroid Plexus:   Not well visualized    Stomach:          Appears normal, left
                                                            sided
 Cerebellum:       Not well visualized    Abdomen:          Appears normal
 Posterior Fossa:  Not well visualized    Abdominal Wall:   Not well visualized
 Nuchal Fold:      Not applicable (>20    Cord Vessels:     Not well visualized
                   wks GA)
 Face:             Basic anatomy          Kidneys:          Appear normal
                   exam per order
 Lips:             Not well visualized    Bladder:          Appears normal
 Heart:            Not well visualized    Spine:            Not well visualized
 RVOT:             Not well visualized    Lower             Not well visualized
                                          Extremities:
 LVOT:             Not well visualized    Upper             Not well visualized
                                          Extremities:

 Other:  Technicallly difficult due to advanced GA and maternal habitus. Per
         patient, fetus is male.
Targeted Anatomy

 Fetal Central Nervous System
 Lat. Ventricles:
Cervix Uterus Adnexa

 Cervix:       Not visualized (advanced GA >34 wks)
 Left Ovary:   Not visualized.
 Right Ovary:  Not visualized.

 Adnexa:     No abnormality visualized.
Comments

 Because of the low amniotic fluid volume, this report was
 called and fetal doppler deferred at present as the patient is
 to be admitted.
Impression

 Single intrauterine gestation demonstrating an estimated
 gestational age by ultrasound of 36w 6d. This is correlated
 with expected estimated gestational age by clinical EDD of
 37w 3d. EFW is currently at the 61%.

 Visualized fetal anatomy appears normal but overall
 assessment is compromised by advanced gestational age
 combined with fetal postition.

 No focal placental abnormality is seen.

 Subjectively decreased and quantitatively low normal
 amniotic fluid volume with an AFI at the 16%.

## 2013-03-17 MED ORDER — OXYCODONE-ACETAMINOPHEN 5-325 MG PO TABS: 1.0000 | ORAL_TABLET | ORAL | Status: DC | PRN

## 2013-03-17 MED ORDER — LABETALOL HCL 5 MG/ML IV SOLN
20.0000 mg | Freq: Once | INTRAVENOUS | Status: AC
  Administered 2013-03-17: 20 mg via INTRAVENOUS
  Filled 2013-03-17: qty 4

## 2013-03-17 MED ORDER — LIDOCAINE HCL (PF) 1 % IJ SOLN
30.0000 mL | INTRAMUSCULAR | Status: DC | PRN
  Filled 2013-03-17 (×2): qty 30

## 2013-03-17 MED ORDER — LACTATED RINGERS IV SOLN
INTRAVENOUS | Status: DC
  Administered 2013-03-17: 125 mL/h via INTRAVENOUS

## 2013-03-17 MED ORDER — MISOPROSTOL 25 MCG QUARTER TABLET
25.0000 ug | ORAL_TABLET | ORAL | Status: DC | PRN
  Administered 2013-03-17 (×2): 25 ug via VAGINAL
  Filled 2013-03-17: qty 1
  Filled 2013-03-17 (×2): qty 0.25

## 2013-03-17 MED ORDER — TERBUTALINE SULFATE 1 MG/ML IJ SOLN: 0.2500 mg | Freq: Once | INTRAMUSCULAR | Status: AC | PRN

## 2013-03-17 MED ORDER — EPHEDRINE 5 MG/ML INJ
10.0000 mg | INTRAVENOUS | Status: DC | PRN
  Filled 2013-03-17: qty 2

## 2013-03-17 MED ORDER — DIPHENHYDRAMINE HCL 50 MG/ML IJ SOLN: 12.5000 mg | INTRAMUSCULAR | Status: DC | PRN

## 2013-03-17 MED ORDER — LACTATED RINGERS IV SOLN: 500.0000 mL | Freq: Once | INTRAVENOUS | Status: DC

## 2013-03-17 MED ORDER — FENTANYL 2.5 MCG/ML BUPIVACAINE 1/10 % EPIDURAL INFUSION (WH - ANES)
14.0000 mL/h | INTRAMUSCULAR | Status: DC | PRN
  Filled 2013-03-17: qty 125

## 2013-03-17 MED ORDER — ONDANSETRON HCL 4 MG/2ML IJ SOLN: 4.0000 mg | Freq: Four times a day (QID) | INTRAMUSCULAR | Status: DC | PRN

## 2013-03-17 MED ORDER — IBUPROFEN 600 MG PO TABS
600.0000 mg | ORAL_TABLET | Freq: Four times a day (QID) | ORAL | Status: DC | PRN
  Administered 2013-03-18: 600 mg via ORAL
  Filled 2013-03-17: qty 1

## 2013-03-17 MED ORDER — CITRIC ACID-SODIUM CITRATE 334-500 MG/5ML PO SOLN: 30.0000 mL | ORAL | Status: DC | PRN

## 2013-03-17 MED ORDER — ACETAMINOPHEN 325 MG PO TABS: 650.0000 mg | ORAL_TABLET | ORAL | Status: DC | PRN

## 2013-03-17 MED ORDER — EPHEDRINE 5 MG/ML INJ
10.0000 mg | INTRAVENOUS | Status: DC | PRN
  Filled 2013-03-17: qty 2
  Filled 2013-03-17: qty 4

## 2013-03-17 MED ORDER — OXYTOCIN 40 UNITS IN LACTATED RINGERS INFUSION - SIMPLE MED
62.5000 mL/h | INTRAVENOUS | Status: DC
  Filled 2013-03-17: qty 1000

## 2013-03-17 MED ORDER — OXYTOCIN BOLUS FROM INFUSION
500.0000 mL | INTRAVENOUS | Status: DC
  Administered 2013-03-18: 500 mL via INTRAVENOUS

## 2013-03-17 MED ORDER — LACTATED RINGERS IV SOLN: 500.0000 mL | INTRAVENOUS | Status: DC | PRN

## 2013-03-17 MED ORDER — PHENYLEPHRINE 40 MCG/ML (10ML) SYRINGE FOR IV PUSH (FOR BLOOD PRESSURE SUPPORT)
80.0000 ug | PREFILLED_SYRINGE | INTRAVENOUS | Status: DC | PRN
  Filled 2013-03-17: qty 2
  Filled 2013-03-17: qty 5

## 2013-03-17 MED ORDER — NALBUPHINE SYRINGE 5 MG/0.5 ML
10.0000 mg | INJECTION | INTRAMUSCULAR | Status: DC | PRN
  Administered 2013-03-17: 10 mg via INTRAVENOUS
  Filled 2013-03-17: qty 0.5
  Filled 2013-03-17: qty 1
  Filled 2013-03-17: qty 0.5

## 2013-03-17 MED ORDER — ONDANSETRON 4 MG PO TBDP
ORAL_TABLET | ORAL | Status: AC
  Administered 2013-03-17: 4 mg
  Filled 2013-03-17: qty 1

## 2013-03-17 MED ORDER — PHENYLEPHRINE 40 MCG/ML (10ML) SYRINGE FOR IV PUSH (FOR BLOOD PRESSURE SUPPORT)
80.0000 ug | PREFILLED_SYRINGE | INTRAVENOUS | Status: DC | PRN
  Filled 2013-03-17: qty 2

## 2013-03-17 NOTE — H&P (Signed)
Attestation of Attending Supervision of Obstetric Fellow: Evaluation and management procedures were performed by the Obstetric Fellow under my supervision and collaboration.  I have reviewed the Obstetric Fellow's note and chart, and I agree with the management and plan.  Erdine Hulen, MD, FACOG Attending Obstetrician & Gynecologist Faculty Practice, [facility]   

## 2013-03-17 NOTE — MAU Provider Note (Signed)
History     CSN: [id number]  Arrival date and time: 03/17/13 0747   First Provider Initiated Contact with Patient 03/17/13 1132      Chief Complaint  Patient presents with  . Back Pain   HPI 22yo G1P0 at [redacted]w[redacted]d presents today from transfer from [facility] for back pain and elevated BP. She started having back pain last night around 0200, describes as sharp, rates 8/10, comes in "waves" every 6 minutes, but also constantly there. The pain got worse earlier this morning but came back to the way it started. No bleeding, no gush of fluid, minimal lower abdominal pain. Feels baby moving fine. No headache, no dizziness, no change in vision, no RUQ pain. No CP, SOB, diarrhea/constipation.  Jeani Hawking recorded BPs of 170/99 and 158/91  Prenatal course: Care at [facility] - EDD by 8wk [place] - Normal NT/IT genetic screen - Normal GTT - Occassional high BPs to 140/80 on visits, never diagnosed as GHTN  OB History   Grav Para Term Preterm Abortions TAB SAB Ect Mult Living   1               Past Medical History  Diagnosis Date  . Pregnant     Past Surgical History  Procedure Laterality Date  . No past surgeries      Family History  Problem Relation Age of Onset  . Hypertension Other   . Coronary artery disease Other   . Cancer Other   . Heart disease Other   . Glaucoma Mother   . Heart attack Father   . Cancer Maternal Aunt     breast  . Diabetes Maternal Uncle   . Heart attack Paternal Uncle     History  Substance Use Topics  . Smoking status: Former Smoker    Types: Cigarettes  . Smokeless tobacco: Never Used  . Alcohol Use: No    Allergies: No Known Allergies  Prescriptions prior to admission  Medication Sig Dispense Refill  . Prenatal Vit-Fe Fumarate-FA (PRENATAL MULTIVITAMIN) TABS Take 1 tablet by mouth daily.        ROS negative except as above  Physical Exam   Blood pressure 93/70, pulse 99, temperature 97.9 F (36.6 C), temperature source Oral,  resp. rate 20, height 5\' 8"  (1.727 m), weight 128.822 kg (284 lb), last menstrual period 06/11/2012, SpO2 100.00%.  Physical Exam General appearance: alert, cooperative and no distress Head: Normocephalic, without obvious abnormality, atraumatic Eyes: conjunctivae/corneas clear. PERRL, EOM's intact. Fundi benign. Back: no tenderness to percussion or palpation Lungs: clear to auscultation bilaterally Heart: regular rate and rhythm, S1, S2 normal, no murmur, click, rub or gallop Abdomen: gravid, mild tenderness to palpation of fundus and LLQ Extremities: extremities normal, atraumatic, no cyanosis or edema and no edema, redness or tenderness in the calves or thighs Pulses: 2+ and symmetric radial, PT and DP Neurologic: Reflexes: 2+ and symmetric patellar and achilles  FHT: 135bpm, mod var, 15x15 accels present, no decels Toco: no ctx on monitor  MAU Course  Procedures Results for orders placed during the hospital encounter of 03/17/13 (from the past 24 hour(s))  URINALYSIS, ROUTINE W REFLEX MICROSCOPIC     Status: Abnormal   Collection Time    03/17/13  8:13 AM      Result Value Range   Color, Urine YELLOW  YELLOW   APPearance CLEAR  CLEAR   Specific Gravity, Urine 1.015  1.005 - 1.030   pH 6.5  5.0 - 8.0  Glucose, UA NEGATIVE  NEGATIVE mg/dL   Hgb urine dipstick NEGATIVE  NEGATIVE   Bilirubin Urine NEGATIVE  NEGATIVE   Ketones, ur 40 (*) NEGATIVE mg/dL   Protein, ur TRACE (*) NEGATIVE mg/dL   Urobilinogen, UA 1.0  0.0 - 1.0 mg/dL   Nitrite NEGATIVE  NEGATIVE   Leukocytes, UA NEGATIVE  NEGATIVE  URINE MICROSCOPIC-ADD ON     Status: Abnormal   Collection Time    03/17/13  8:13 AM      Result Value Range   Squamous Epithelial / LPF MANY (*) RARE   WBC, UA 0-2  <3 WBC/hpf   Bacteria, UA MANY (*) RARE  COMPREHENSIVE METABOLIC PANEL     Status: Abnormal   Collection Time    03/17/13 12:00 PM      Result Value Range   Sodium 136  135 - 145 mEq/L   Potassium 3.8  3.5 - 5.1  mEq/L   Chloride 103  96 - 112 mEq/L   CO2 22  19 - 32 mEq/L   Glucose, Bld 87  70 - 99 mg/dL   BUN 9  6 - 23 mg/dL   Creatinine, Ser 0.45  0.50 - 1.10 mg/dL   Calcium 9.5  8.4 - 40.9 mg/dL   Total Protein 6.1  6.0 - 8.3 g/dL   Albumin 3.1 (*) 3.5 - 5.2 g/dL   AST 21  0 - 37 U/L   ALT 27  0 - 35 U/L   Alkaline Phosphatase 122 (*) 39 - 117 U/L   Total Bilirubin 0.5  0.3 - 1.2 mg/dL   GFR calc non Af Amer >90  >90 mL/min   GFR calc Af Amer >90  >90 mL/min  CBC     Status: Abnormal   Collection Time    03/17/13 12:00 PM      Result Value Range   WBC 9.8  4.0 - 10.5 K/uL   RBC 3.69 (*) 3.87 - 5.11 MIL/uL   Hemoglobin 11.9 (*) 12.0 - 15.0 g/dL   HCT 81.1 (*) 91.4 - 78.2 %   MCV 90.8  78.0 - 100.0 fL   MCH 32.2  26.0 - 34.0 pg   MCHC 35.5  30.0 - 36.0 g/dL   RDW 95.6  21.3 - 08.6 %   Platelets 175  150 - 400 K/uL  PROTEIN / CREATININE RATIO, URINE     Status: None   Collection Time    03/17/13 12:18 PM      Result Value Range   Creatinine, Urine 224.04     Total Protein, Urine 28.7     PROTEIN CREATININE RATIO 0.13  0.00 - 0.15    MDM   Assessment and Plan  22yo G1P0 [redacted]w[redacted]d   # PIH workup - CMP/CBC/Urine Pr:Cr - AST/ALT normal, platelets normal, Pr:Cr 0.13 - Initial BP 140/76 and 123/78 at MAU - Prelim ultrasound shows low fluid  Will admit to L&D for induction  Tawni Carnes 03/17/2013, 11:44 AM   I saw and examined patient and agree with above resident note. I reviewed history, imaging, labs, and vitals. I personally reviewed the fetal heart tracing, and it is reactive. See separate H&P. Napoleon Form, MD

## 2013-03-17 NOTE — ED Notes (Signed)
Pt states her OB is Dr. Emelda Fear. This is her first pregnancy without complications. Due date July 29th. Last OB visit was last Wednesday.

## 2013-03-17 NOTE — ED Notes (Signed)
[facility]  Called for [facility] to [facility].

## 2013-03-17 NOTE — MAU Provider Note (Signed)
Attestation of Attending Supervision of Obstetric Fellow: Evaluation and management procedures were performed by the Obstetric Fellow under my supervision and collaboration.  I have reviewed the Obstetric Fellow's note and chart, and I agree with the management and plan to induce patient for [facility].  Jaynie Collins, MD, FACOG Attending Obstetrician & Gynecologist Faculty Practice, [facility]

## 2013-03-17 NOTE — Progress Notes (Signed)
Dr. Waynetta Sandy informed of elevated BP readings by C. Roxan Hockey RN.  No new orders @ this time.

## 2013-03-17 NOTE — ED Notes (Signed)
[facility] called and reported that monitor not tracing baby's heart rate. Monitor readjusted, baby heart beat found at 142-144. Awaiting further instructions from OB.

## 2013-03-17 NOTE — ED Notes (Addendum)
Upon assessment, pt "Waves" of lower back pain are increasing in frequency and intensity. EDP and ROB RN aware. No new orders given at this time.

## 2013-03-17 NOTE — ED Notes (Signed)
Pt is 38 weeks pregant. Complain of low back pain that started at 0200 this morning. States pain comes and goes

## 2013-03-17 NOTE — ED Provider Notes (Signed)
History    This chart was scribed for Benny Lennert, MD, MD by Ashley Jacobs, ED Scribe and Bennett Scrape, ED Scribe. The patient was seen in room APA01/APA01 and the patient's care was started at 8:00 AM.   CSN: [id number] Arrival date & time 03/17/13  0747    Chief Complaint  Patient presents with  . Back Pain     Patient is a 22 y.o. female presenting with back pain. The history is provided by the patient and medical records. No language interpreter was used.  Back Pain Pain severity:  Severe Onset quality:  Gradual Duration:  6 hours Timing:  Intermittent Progression:  Waxing and waning Chronicity:  New Relieved by:  Nothing Worsened by:  Nothing tried Ineffective treatments:  None tried Associated symptoms: no abdominal pain, no chest pain and no headaches     HPI Comments: Valerie Rasmussen is a 22 y.o. female who is currently [redacted] weeks pregnant presents to the Emergency Department complaining of intermittent back pain with episodes lasting 2 minutes and occuring every 10 minutes with an onset of 2 AM today. She denies having pain currently. Pt reports that this is her first pregnancy and she denies having a h/o of previous complications with this pregnancy. She denies abdominal pain, vaginal bleeding and vaginal leaking as associated symptoms. She denies having a h/o health problems.   Pt plans to deliver at [facility] OB-GYN is Delbert Harness   Past Medical History  Diagnosis Date  . Pregnant    Past Surgical History  Procedure Laterality Date  . No past surgeries     Family History  Problem Relation Age of Onset  . Hypertension Other   . Coronary artery disease Other   . Cancer Other   . Heart disease Other   . Glaucoma Mother   . Heart attack Father   . Cancer Maternal Aunt     breast  . Diabetes Maternal Uncle   . Heart attack Paternal Uncle    History  Substance Use Topics  . Smoking status: Former Smoker    Types: Cigarettes  .  Smokeless tobacco: Never Used  . Alcohol Use: No   OB History   Grav Para Term Preterm Abortions TAB SAB Ect Mult Living   1              Review of Systems  Constitutional: Negative for appetite change and fatigue.  HENT: Negative for congestion, sinus pressure and ear discharge.   Eyes: Negative for discharge.  Respiratory: Negative for cough.   Cardiovascular: Negative for chest pain.  Gastrointestinal: Negative for abdominal pain and diarrhea.  Genitourinary: Negative for frequency and hematuria.  Musculoskeletal: Positive for back pain.  Skin: Negative for rash.  Neurological: Negative for seizures and headaches.  Psychiatric/Behavioral: Negative for hallucinations.    Allergies  Review of patient's allergies indicates no known allergies.  Home Medications   Current Outpatient Rx  Name  Route  Sig  Dispense  Refill  . Prenatal Vit-Fe Fumarate-FA (PRENATAL MULTIVITAMIN) TABS   Oral   Take 1 tablet by mouth daily.          BP 170/99  Pulse 85  Temp(Src) 98.3 F (36.8 C) (Oral)  Ht 5\' 8"  (1.727 m)  Wt 284 lb (128.822 kg)  BMI 43.19 kg/m2  SpO2 100%  LMP 06/11/2012 Physical Exam  Nursing note and vitals reviewed. Constitutional: She is oriented to person, place, and time. She appears well-developed and well-nourished.  HENT:  Head: Normocephalic and atraumatic.  Eyes: Conjunctivae and EOM are normal. No scleral icterus.  Neck: Normal range of motion. Neck supple. No thyromegaly present.  Cardiovascular: Normal rate and regular rhythm.  Exam reveals no gallop and no friction rub.   No murmur heard. Pulmonary/Chest: No stridor. She has no wheezes. She has no rales. She exhibits no tenderness.  Abdominal: She exhibits no distension. There is no tenderness. There is no rebound.  Gravid    Genitourinary:  cervic is 1 cm dilated 50% diffaced Baby is vertex presentation Chaperone present  Musculoskeletal: Normal range of motion. She exhibits no edema.   Lymphadenopathy:    She has no cervical adenopathy.  Neurological: She is alert and oriented to person, place, and time. Coordination normal.  Skin: Skin is warm and dry. No rash noted. No erythema.  Psychiatric: She has a normal mood and affect. Her behavior is normal.    ED Course  Procedures (including critical care time) DIAGNOSTIC STUDIES: Oxygen Saturation is 100% on room air, normal by my interpretation.    COORDINATION OF CARE: 8:05 AM. Discussed course of care with pt. Pt understands and agrees.  9:10 AM-Consult complete with Dr. Macon Large, OB-GYN on-call. Patient case explained and discussed. Dr. Macon Large agrees to transfer and admit patient for further evaluation and [facility]. Call ended at 9:12 AM.  9:15 AM-Pt informed of [facility] to [facility] for observation and pt agreed.   Labs Reviewed  URINALYSIS, ROUTINE W REFLEX MICROSCOPIC - Abnormal; Notable for the following:    Ketones, ur 40 (*)    Protein, ur TRACE (*)    All other components within normal limits  URINE MICROSCOPIC-ADD ON - Abnormal; Notable for the following:    Squamous Epithelial / LPF MANY (*)    Bacteria, UA MANY (*)    All other components within normal limits   No results found. No diagnosis found.  MDM   The chart was scribed for me under my direct supervision.  I personally performed the history, physical, and medical decision making and all procedures in the evaluation of this patient.Benny Lennert, MD 03/17/13 [phone]

## 2013-03-17 NOTE — H&P (Signed)
Valerie Rasmussen is a 22 y.o. female presenting for induction of labor for gestational hypertension. History  HPI 22yo G1P0 at [redacted]w[redacted]d presents today from transfer from [facility] for back pain and elevated BP. She started having back pain last night around 0200, describes as sharp, rates 8/10, comes in "waves" every 6 minutes, but also constantly there. The pain got worse earlier this morning but came back to the way it started. No bleeding, no gush of fluid, minimal lower abdominal pain. Feels baby moving fine. No headache, no dizziness, no change in vision, no RUQ pain. No CP, SOB, diarrhea/constipation.  Jeani Hawking recorded BPs of 170/99 and 158/91, given labetalol 20 mg IV.  Prenatal course: Care at [facility] - EDD by 8wk [place] - Normal NT/IT genetic screen - Normal GTT  OB History   Grav Para Term Preterm Abortions TAB SAB Ect Mult Living   1              Past Medical History  Diagnosis Date  . Pregnant    Past Surgical History  Procedure Laterality Date  . No past surgeries     Family History: family history includes Cancer in her maternal aunt and other; Coronary artery disease in her other; Diabetes in her maternal uncle; Glaucoma in her mother; Heart attack in her father and paternal uncle; Heart disease in her other; and Hypertension in her other. Social History:  reports that she has quit smoking. Her smoking use included Cigarettes. She smoked 0.00 packs per day. She has never used smokeless tobacco. She reports that she uses illicit drugs (Marijuana). She reports that she does not drink alcohol.   Prenatal Transfer Tool  Maternal Diabetes: No Genetic Screening: Normal Maternal Ultrasounds/Referrals: Normal Fetal Ultrasounds or other Referrals:  None Maternal Substance Abuse:  Yes:  Type: Marijuana Significant Maternal Medications:  None Significant Maternal Lab Results:  None Other Comments:  None  ROS negative except as above  Blood pressure 104/68, pulse 71,  temperature 97.9 F (36.6 C), temperature source Oral, resp. rate 20, height 5\' 8"  (1.727 m), weight 128.822 kg (284 lb), last menstrual period 06/11/2012, SpO2 100.00%.  Filed Vitals:   03/17/13 1518 03/17/13 1551 03/17/13 1624 03/17/13 1714  BP: 131/114 143/81  139/94  Pulse: 77 85  84  Temp:  97.8 F (36.6 C)    TempSrc:  Oral    Resp:  18 20 18   Height:      Weight:      SpO2:        Exam Physical Exam  General appearance: alert, cooperative and no distress  Head: Normocephalic, without obvious abnormality, atraumatic  Eyes: conjunctivae/corneas clear. PERRL, EOM's intact. Fundi benign.  Back: no tenderness to percussion or palpation  Lungs: clear to auscultation bilaterally  Heart: regular rate and rhythm, S1, S2 normal, no murmur, click, rub or gallop  Abdomen: gravid, mild tenderness to palpation of fundus and LLQ  Extremities: extremities normal, atraumatic, no cyanosis or edema and no edema, redness or tenderness in the calves or thighs  Pulses: 2+ and symmetric radial, PT and DP  Neurologic: Reflexes: 2+ and symmetric patellar and achilles   FHT: 135bpm, mod var, 15x15 accels present, no decels  Toco: no ctx on monitor  Cervical exam at [facility] Dilation: 1 Exam by:: Dr. Estell Harpin   Prenatal labs: ABO, Rh: O/Positive/-- (12/18 0000) Antibody: NEG (05/06 0916) Rubella: Equivocal (12/18 0000) RPR: NON REAC (05/06 0916)  HBsAg: Negative (12/18 0000)  HIV: NON  REACTIVE (05/06 0916)  GBS:   pending  Results for orders placed during the hospital encounter of 03/17/13 (from the past 24 hour(s))  URINALYSIS, ROUTINE W REFLEX MICROSCOPIC     Status: Abnormal   Collection Time    03/17/13  8:13 AM      Result Value Range   Color, Urine YELLOW  YELLOW   APPearance CLEAR  CLEAR   Specific Gravity, Urine 1.015  1.005 - 1.030   pH 6.5  5.0 - 8.0   Glucose, UA NEGATIVE  NEGATIVE mg/dL   Hgb urine dipstick NEGATIVE  NEGATIVE   Bilirubin Urine NEGATIVE  NEGATIVE    Ketones, ur 40 (*) NEGATIVE mg/dL   Protein, ur TRACE (*) NEGATIVE mg/dL   Urobilinogen, UA 1.0  0.0 - 1.0 mg/dL   Nitrite NEGATIVE  NEGATIVE   Leukocytes, UA NEGATIVE  NEGATIVE  URINE MICROSCOPIC-ADD ON     Status: Abnormal   Collection Time    03/17/13  8:13 AM      Result Value Range   Squamous Epithelial / LPF MANY (*) RARE   WBC, UA 0-2  <3 WBC/hpf   Bacteria, UA MANY (*) RARE  COMPREHENSIVE METABOLIC PANEL     Status: Abnormal   Collection Time    03/17/13 12:00 PM      Result Value Range   Sodium 136  135 - 145 mEq/L   Potassium 3.8  3.5 - 5.1 mEq/L   Chloride 103  96 - 112 mEq/L   CO2 22  19 - 32 mEq/L   Glucose, Bld 87  70 - 99 mg/dL   BUN 9  6 - 23 mg/dL   Creatinine, Ser 1.61  0.50 - 1.10 mg/dL   Calcium 9.5  8.4 - 09.6 mg/dL   Total Protein 6.1  6.0 - 8.3 g/dL   Albumin 3.1 (*) 3.5 - 5.2 g/dL   AST 21  0 - 37 U/L   ALT 27  0 - 35 U/L   Alkaline Phosphatase 122 (*) 39 - 117 U/L   Total Bilirubin 0.5  0.3 - 1.2 mg/dL   GFR calc non Af Amer >90  >90 mL/min   GFR calc Af Amer >90  >90 mL/min  CBC     Status: Abnormal   Collection Time    03/17/13 12:00 PM      Result Value Range   WBC 9.8  4.0 - 10.5 K/uL   RBC 3.69 (*) 3.87 - 5.11 MIL/uL   Hemoglobin 11.9 (*) 12.0 - 15.0 g/dL   HCT 04.5 (*) 40.9 - 81.1 %   MCV 90.8  78.0 - 100.0 fL   MCH 32.2  26.0 - 34.0 pg   MCHC 35.5  30.0 - 36.0 g/dL   RDW 91.4  78.2 - 95.6 %   Platelets 175  150 - 400 K/uL  PROTEIN / CREATININE RATIO, URINE     Status: None   Collection Time    03/17/13 12:18 PM      Result Value Range   Creatinine, Urine 224.04     Total Protein, Urine 28.7     PROTEIN CREATININE RATIO 0.13  0.00 - 0.15    Assessment/Plan: 22yo G1P0 [redacted]w[redacted]d   # PIH workup - AST 21 ALT 27, Platelets 175, Urine Pr:Cr 0.13 - Ultrasound Prelim shows low fluid  GBS pending  Admit for induction of labor for gestational hypertension with decreased amniotic fluid  Tawni Carnes 03/17/2013, 2:31 PM  I saw and  examined  patient and agree with above resident note. I reviewed history, imaging, labs, and vitals. I personally reviewed the fetal heart tracing, and it is reactive. Napoleon Form, MD

## 2013-03-17 NOTE — ED Notes (Addendum)
Pt blood pressure re-assessed and is now 158/91. Pt denies any dizziness,headache, or blurred/double vison. OB rapid response nurse recommendations are to transfer pt for further evaluation at [facility]U. EDP aware and calling attending at [facility]U to coordinate transfer.

## 2013-03-17 NOTE — Progress Notes (Addendum)
1610 Received call about this patient presenting to [facility] with complaint of back pain since 0200.  Denies bleeding, loss of fluid or vaginal discharge.  0830 FHR tracing intermittent, UC's unclear, ED RN Victorino Dike asked to adjust monitors.[place].Nones ED RN Victorino Dike asked to repeat BP and ask patient if she has HA, blurry vision, dizziness, RUQ abdomen pain. 0900 ED RN Victorino Dike continues to try to adjust EFM. Reports repeat BP 158/91 and patient denies HA, blurry vision, dizziness, or RUQ abdomen pain.  Also denies feeling badly other than back pain for 2 days which increased in intensity last night.  Per Victorino Dike, RN no significant edema noted. [id number]  ED RN adj EFM again, asked to try to trace a few more minutes prior to removing EFM for [facility] transfer to [facility].

## 2013-03-17 NOTE — ED Notes (Signed)
Per EDP assessment, Pt 1 cm,50% effaced but not actively contracting at this time.

## 2013-03-17 NOTE — MAU Note (Signed)
Patient arrived via [facility] from [facility] for further evaluation for back pain and high blood pressure. Patient states having sharp back pains since 2 am that are now 6 minutes apart. Denies bleeding or leaking of fluid. Denies headache, dizziness, nor blurry vision.

## 2013-03-18 ENCOUNTER — Encounter (HOSPITAL_COMMUNITY): Payer: Self-pay | Admitting: *Deleted

## 2013-03-18 ENCOUNTER — Inpatient Hospital Stay (HOSPITAL_COMMUNITY): Payer: BC Managed Care – PPO | Admitting: Anesthesiology

## 2013-03-18 ENCOUNTER — Encounter (HOSPITAL_COMMUNITY): Payer: Self-pay | Admitting: Anesthesiology

## 2013-03-18 DIAGNOSIS — O4100X0 Oligohydramnios, unspecified trimester, not applicable or unspecified: Secondary | ICD-10-CM

## 2013-03-18 DIAGNOSIS — O139 Gestational [pregnancy-induced] hypertension without significant proteinuria, unspecified trimester: Secondary | ICD-10-CM

## 2013-03-18 MED ORDER — ZOLPIDEM TARTRATE 5 MG PO TABS: 5.0000 mg | ORAL_TABLET | Freq: Every evening | ORAL | Status: DC | PRN

## 2013-03-18 MED ORDER — BENZOCAINE-MENTHOL 20-0.5 % EX AERO: 1.0000 "application " | INHALATION_SPRAY | CUTANEOUS | Status: DC | PRN

## 2013-03-18 MED ORDER — OXYTOCIN 40 UNITS IN LACTATED RINGERS INFUSION - SIMPLE MED
1.0000 m[IU]/min | INTRAVENOUS | Status: DC
  Administered 2013-03-18: 1 m[IU]/min via INTRAVENOUS

## 2013-03-18 MED ORDER — TETANUS-DIPHTH-ACELL PERTUSSIS 5-2.5-18.5 LF-MCG/0.5 IM SUSP
0.5000 mL | Freq: Once | INTRAMUSCULAR | Status: AC
  Administered 2013-03-18: 0.5 mL via INTRAMUSCULAR

## 2013-03-18 MED ORDER — SIMETHICONE 80 MG PO CHEW: 80.0000 mg | CHEWABLE_TABLET | ORAL | Status: DC | PRN

## 2013-03-18 MED ORDER — SENNOSIDES-DOCUSATE SODIUM 8.6-50 MG PO TABS
2.0000 | ORAL_TABLET | Freq: Every day | ORAL | Status: DC
  Administered 2013-03-18: 2 via ORAL

## 2013-03-18 MED ORDER — LANOLIN HYDROUS EX OINT: TOPICAL_OINTMENT | CUTANEOUS | Status: DC | PRN

## 2013-03-18 MED ORDER — LIDOCAINE HCL (PF) 1 % IJ SOLN
INTRAMUSCULAR | Status: DC | PRN
  Administered 2013-03-18 (×2): 5 mL
  Administered 2013-03-18: 3 mL

## 2013-03-18 MED ORDER — DIPHENHYDRAMINE HCL 25 MG PO CAPS: 25.0000 mg | ORAL_CAPSULE | Freq: Four times a day (QID) | ORAL | Status: DC | PRN

## 2013-03-18 MED ORDER — IBUPROFEN 600 MG PO TABS
600.0000 mg | ORAL_TABLET | Freq: Four times a day (QID) | ORAL | Status: DC
  Administered 2013-03-18 – 2013-03-20 (×7): 600 mg via ORAL
  Filled 2013-03-18 (×8): qty 1

## 2013-03-18 MED ORDER — OXYCODONE-ACETAMINOPHEN 5-325 MG PO TABS: 1.0000 | ORAL_TABLET | ORAL | Status: DC | PRN

## 2013-03-18 MED ORDER — ONDANSETRON HCL 4 MG/2ML IJ SOLN: 4.0000 mg | INTRAMUSCULAR | Status: DC | PRN

## 2013-03-18 MED ORDER — WITCH HAZEL-GLYCERIN EX PADS: 1.0000 "application " | MEDICATED_PAD | CUTANEOUS | Status: DC | PRN

## 2013-03-18 MED ORDER — ONDANSETRON HCL 4 MG PO TABS: 4.0000 mg | ORAL_TABLET | ORAL | Status: DC | PRN

## 2013-03-18 MED ORDER — DIBUCAINE 1 % RE OINT: 1.0000 "application " | TOPICAL_OINTMENT | RECTAL | Status: DC | PRN

## 2013-03-18 MED ORDER — PRENATAL MULTIVITAMIN CH
1.0000 | ORAL_TABLET | Freq: Every day | ORAL | Status: DC
  Administered 2013-03-18 – 2013-03-19 (×2): 1 via ORAL
  Filled 2013-03-18 (×2): qty 1

## 2013-03-18 MED ORDER — TERBUTALINE SULFATE 1 MG/ML IJ SOLN: 0.2500 mg | Freq: Once | INTRAMUSCULAR | Status: DC | PRN

## 2013-03-18 MED ORDER — FENTANYL 2.5 MCG/ML BUPIVACAINE 1/10 % EPIDURAL INFUSION (WH - ANES)
INTRAMUSCULAR | Status: DC | PRN
  Administered 2013-03-18: 14 mL/h via EPIDURAL

## 2013-03-18 NOTE — Anesthesia Procedure Notes (Signed)
Epidural Patient location during procedure: OB  Staffing Anesthesiologist: Jolee Critcher Performed by: anesthesiologist   Preanesthetic Checklist Completed: patient identified, site marked, surgical consent, pre-op evaluation, timeout performed, IV checked, risks and benefits discussed and monitors and equipment checked  Epidural Patient position: sitting Prep: ChloraPrep Patient monitoring: heart rate, continuous pulse ox and blood pressure Approach: midline Injection technique: LOR saline  Needle:  Needle type: Tuohy  Needle gauge: 17 G Needle length: 9 cm and 9 Needle insertion depth: 8 cm Catheter type: closed end flexible Catheter size: 20 Guage Catheter at skin depth: 14 cm Test dose: negative  Assessment Events: blood not aspirated, injection not painful, no injection resistance, negative IV test and no paresthesia  Additional Notes   Patient tolerated the insertion well without complications.   

## 2013-03-18 NOTE — Anesthesia Preprocedure Evaluation (Signed)
Anesthesia Evaluation  Patient identified by MRN, date of birth, ID band Patient awake    Reviewed: Allergy & Precautions, H&P , NPO status , Patient's Chart, lab work & pertinent test results  History of Anesthesia Complications Negative for: history of anesthetic complications  Airway Mallampati: II TM Distance: >3 FB Neck ROM: full    Dental no notable dental hx. (+) Teeth Intact   Pulmonary neg pulmonary ROS,  breath sounds clear to auscultation  Pulmonary exam normal       Cardiovascular hypertension, negative cardio ROS  Rhythm:regular Rate:Normal     Neuro/Psych negative neurological ROS  negative psych ROS   GI/Hepatic negative GI ROS, Neg liver ROS,   Endo/Other  negative endocrine ROSMorbid obesity  Renal/GU negative Renal ROS  negative genitourinary   Musculoskeletal   Abdominal Normal abdominal exam  (+)   Peds  Hematology negative hematology ROS (+)   Anesthesia Other Findings   Reproductive/Obstetrics (+) Pregnancy                          Anesthesia Physical Anesthesia Plan  ASA: III  Anesthesia Plan: Epidural   Post-op Pain Management:    Induction:   Airway Management Planned:   Additional Equipment:   Intra-op Plan:   Post-operative Plan:   Informed Consent: I have reviewed the patients History and Physical, chart, labs and discussed the procedure including the risks, benefits and alternatives for the proposed anesthesia with the patient or authorized representative who has indicated his/her understanding and acceptance.     Plan Discussed with:   Anesthesia Plan Comments:         Anesthesia Quick Evaluation  

## 2013-03-18 NOTE — Progress Notes (Signed)
Pt c/o pain in bladder, rolling from side to side, adj. Monitors constantly.

## 2013-03-18 NOTE — Progress Notes (Signed)
Pt c/o of being very uncomfortable and feeling lots of pressure in bladder.  States catheter is hurting her.  Some fluid taken from bulb in foley catheter.  Try readjusting catheter.  Pt

## 2013-03-18 NOTE — Anesthesia Postprocedure Evaluation (Signed)
Anesthesia Post Note  Patient: Valerie Rasmussen  Procedure(s) Performed: * No procedures listed *  Anesthesia type: Epidural  Patient location: Mother/Baby  Post pain: Pain level controlled  Post assessment: Post-op Vital signs reviewed  Last Vitals:  Filed Vitals:   03/18/13 0600  BP: 118/60  Pulse: 86  Temp: 36.8 C  Resp: 16    Post vital signs: Reviewed  Level of consciousness:alert  Complications: No apparent anesthesia complications

## 2013-03-19 MED ORDER — MEASLES, MUMPS & RUBELLA VAC ~~LOC~~ INJ
0.5000 mL | INJECTION | Freq: Once | SUBCUTANEOUS | Status: DC
  Filled 2013-03-19: qty 0.5

## 2013-03-19 NOTE — Clinical Social Work Maternal (Signed)
Clinical Social Work Department PSYCHOSOCIAL ASSESSMENT - MATERNAL/CHILD 03/19/2013  Patient:  Valerie Rasmussen  Account Number:  [id number]  Admit Date:  03/17/2013  Marjo Bicker Name:   Valerie Rasmussen    Clinical Social Worker:  Lulu Riding, LCSW   Date/Time:  03/19/2013 10:00 AM  Date Referred:  03/19/2013   Referral source  CN     Referred reason  Substance Abuse   Other referral source:    I:  FAMILY / HOME ENVIRONMENT Child's legal guardian:  PARENT  Guardian - Name Guardian - Age Guardian - Address  Valerie Rasmussen [address], [place], [place] [address]  Valerie Rasmussen  Valerie Rasmussen   Other household support members/support persons Name Relationship DOB  Valerie Rasmussen MOTHER    Other support:   MOB and MGM state they have a close knit family and good supports    II  PSYCHOSOCIAL DATA Information Source:  Family Interview  Surveyor, quantity and [employer] Employment:   Surveyor, quantity resources:   If Medicaid - County:    School / Grade:   Maternity Care Coordinator / Child Services Coordination / Early Interventions:  Cultural issues impacting care:   None stated    III  STRENGTHS Strengths  Adequate Resources  Compliance with medical plan  Home prepared for Child (including basic supplies)  Other - See comment  Supportive family/friends   Strength comment:  MOB plans to [facility] baby to [facility].   IV  RISK FACTORS AND CURRENT PROBLEMS Current Problem:  None   Risk Factor & Current Problem Patient Issue Family Issue Risk Factor / Current Problem Comment   N N     V  SOCIAL WORK ASSESSMENT  CSW met with MOB and MGM in MOB's first floor room/134 to complete assessment for hx of marijuana use.  MOB states we can discuss anything with her mother present.  MOB appeared tearful and CSW asked if she was ok.  She said she was fine.  CSW also offered to come back at a later time if this wasn't a good time to talk.  She  stated again that she was fine and that CSW could stay.  She was tearful on and off throughout the conversation and eventually told CSW that FOB is in jail and she is crying because he is not here.  She reports it was no crime against her and that they are together at this time.  She states he has been in jail since 02/27/13.  CSW discussed signs and symptoms of PPD and asked that MOB call her doctor if symptoms arise.  CSW states hightened concern since MOB is emotional already regarding FOB's absence.  MGM was very involved in the conversation and states her family will keep an eye on MOB and make sure she gets treatment if concerns arise.  CSW was cautious not to upset MOB further, but stated there was one more thing CSW wanted to address with MOB.  MOB stated, "marijuana."  CSW said yes and MOB states she smoked only on occation when she was feeling ill or like she needed an appetite.  She states she does not recall her last use, but thinks it was around the time FOB went to jail.  CSW informed her of hospital drug screen policy and that baby's UDS was negative.  CSW explained that if the meconium is positive, a CPS report will be made.  MOB was very understanding.  She states no plans to use marijuana  moving forward.  CSW has no further concerns and does not identify any barriers to discharge when MOB and baby are medically ready.   VI SOCIAL WORK PLAN Social Work Plan  No Further Intervention Required / No Barriers to Discharge   Type of pt/family education:   Hospital drug screen policy  PPD signs and symptoms   If child protective services report - county:   If child protective services report - date:   Information/referral to community resources comment:   CSW will make CPS report if meconium is positive.   Other social work plan:   CSW will monitor meconium drug screen results.

## 2013-03-19 NOTE — Progress Notes (Signed)
Post Partum Day 1 Subjective: no complaints, up ad lib, voiding, tolerating PO and has had a bowel movement since delivery  Objective: Blood pressure 135/68, pulse 79, temperature 97.8 F (36.6 C), temperature source Oral, resp. rate 18, height 5\' 8"  (1.727 m), weight 128.822 kg (284 lb), last menstrual period 06/11/2012, SpO2 100.00%, unknown if currently breastfeeding.  Physical Exam:  General: alert, cooperative, appears stated age and no distress Lochia: appropriate Uterine Fundus: firm Incision: n/a DVT Evaluation: No evidence of DVT seen on physical exam. No cords or calf tenderness. No significant calf/ankle edema.   Recent Labs  03/17/13 1200  HGB 11.9*  HCT 33.5*    Assessment/Plan: S/p Induction of labor for Gestational Hypertension Plan for discharge tomorrow Bottle feeding Plans outpatient circumcision Plans nexplanon/implanon for contraception H/o Gestational HTN - BP max 130s/60s over 24 hours ?SW consult for maternal marijuana use   LOS: 2 days   Simone Curia 03/19/2013, 7:44 AM

## 2013-03-19 NOTE — Progress Notes (Signed)
I saw and examined patient and agree with above resident note. I reviewed history, delivery summary, labs and vitals. Kerina Simoneau, MD  

## 2013-03-20 MED ORDER — IBUPROFEN 600 MG PO TABS: 600.0000 mg | ORAL_TABLET | Freq: Four times a day (QID) | ORAL | Status: DC

## 2013-03-20 MED ORDER — MEASLES, MUMPS & RUBELLA VAC ~~LOC~~ INJ
0.5000 mL | INJECTION | Freq: Once | SUBCUTANEOUS | Status: AC
  Administered 2013-03-20: 0.5 mL via SUBCUTANEOUS
  Filled 2013-03-20: qty 0.5

## 2013-03-20 NOTE — Discharge Summary (Signed)
Obstetric Discharge Summary Reason for Admission: induction of labor for gestational HTN and decreased amniotic fluid Prenatal Procedures: Maternal [place] normal; ultrasound on admission with decreased amniotic fluid  Intrapartum Procedures: spontaneous vaginal delivery Postpartum Procedures: none Complications-Operative and Postpartum: none Hemoglobin  Date Value Range Status  03/17/2013 11.9* 12.0 - 15.0 g/dL Final     HCT  Date Value Range Status  03/17/2013 33.5* 36.0 - 46.0 % Final   BP 138/79  Pulse 84  Temp(Src) 98.7 F (37.1 C) (Oral)  Resp 20  Ht 5\' 8"  (1.727 m)  Wt 128.822 kg (284 lb)  BMI 43.19 kg/m2  SpO2 100%  LMP 06/11/2012   Filed Vitals:   03/18/13 1841 03/19/13 0624 03/19/13 1809 03/20/13 0537  BP: 121/80 135/68 143/86 138/79  Pulse: 85 79 81 84  Temp: 98.5 F (36.9 C) 97.8 F (36.6 C) 98.2 F (36.8 C) 98.7 F (37.1 C)  TempSrc: Oral Oral Oral Oral  Resp: 18 18 20 20   Height:      Weight:      SpO2:  100%      Physical Exam:  General: alert, cooperative, appears stated age and no distress Lochia: appropriate Uterine Fundus: firm DVT Evaluation: No evidence of DVT seen on physical exam. No cords or calf tenderness. No significant calf/ankle edema. 2+ bilateral radial pulse with RRR Normal respiratory effort Bottle feeding, plans outpatient circumcision and nexplanon for contraception CSW consulted for maternal marijuana use, also with FOB in jail, provided support, no barriers to d/c of mom. Baby d/c plans pending meconium drug screen with UDS negative.  Discharge Diagnoses: Early Term Pregnancy-delivered  Discharge Information: Date: 03/20/2013 Activity: pelvic rest Diet: routine Medications: PNV, Ibuprofen and Colace Condition: stable Instructions: refer to practice specific booklet Discharge to: home Follow up in 6 weeks at [facility] PIH workup on admission urine P:C 0.13, asymptomatic this stay, BP WNL now at 138/79, over 24 hours as  high as 143/86.  Newborn Data: Live born female  Birth Weight: 5 lb 8 oz (2495 g) APGAR: 8, 9  Home with mother pending meconium drug screen, peds and CSW following.  Simone Curia 03/20/2013, 7:28 AM  I saw and examined patient and agree with above resident note. I reviewed history, delivery summary, labs and vitals. BP are on high side of normal with a few over 140s. F/U at [facility] in one week for BP check. Napoleon Form, MD

## 2013-03-20 NOTE — Progress Notes (Signed)
Ur chart review completed.  

## 2013-03-23 ENCOUNTER — Ambulatory Visit (INDEPENDENT_AMBULATORY_CARE_PROVIDER_SITE_OTHER): Payer: BC Managed Care – PPO | Admitting: Advanced Practice Midwife

## 2013-03-23 ENCOUNTER — Encounter: Payer: Self-pay | Admitting: Advanced Practice Midwife

## 2013-03-23 VITALS — BP 148/96 | Ht 68.0 in | Wt 269.8 lb

## 2013-03-23 DIAGNOSIS — O139 Gestational [pregnancy-induced] hypertension without significant proteinuria, unspecified trimester: Secondary | ICD-10-CM

## 2013-03-23 DIAGNOSIS — O135 Gestational [pregnancy-induced] hypertension without significant proteinuria, complicating the puerperium: Secondary | ICD-10-CM

## 2013-03-23 DIAGNOSIS — Z1389 Encounter for screening for other disorder: Secondary | ICD-10-CM

## 2013-03-23 DIAGNOSIS — Z331 Pregnant state, incidental: Secondary | ICD-10-CM

## 2013-03-23 NOTE — Progress Notes (Signed)
Valerie Rasmussen Filed Vitals:   03/23/13 0947  BP: 148/96   1 week S/P IOL for GHTN/oligohydramnios. She was not discharged home on any meds.    IMP/PLAN:  GHTN, stable without meds F/U 3 weeks for Nexplanon and 5 weeks for PP Check-up

## 2013-04-04 DIAGNOSIS — Z029 Encounter for administrative examinations, unspecified: Secondary | ICD-10-CM

## 2013-04-13 ENCOUNTER — Ambulatory Visit (INDEPENDENT_AMBULATORY_CARE_PROVIDER_SITE_OTHER): Payer: BC Managed Care – PPO | Admitting: Obstetrics & Gynecology

## 2013-04-13 ENCOUNTER — Encounter: Payer: Self-pay | Admitting: Obstetrics & Gynecology

## 2013-04-13 VITALS — BP 130/80 | Ht 68.0 in | Wt 266.0 lb

## 2013-04-13 DIAGNOSIS — Z30017 Encounter for initial prescription of implantable subdermal contraceptive: Secondary | ICD-10-CM

## 2013-04-13 DIAGNOSIS — Z3202 Encounter for pregnancy test, result negative: Secondary | ICD-10-CM

## 2013-04-13 NOTE — Progress Notes (Signed)
Patient ID: Valerie Rasmussen, female   DOB: [birth year], 22 y.o.   MRN: [id number] Patient in for Implanon insertion No sex since June Pregnancy test negative  Left arm wrapped 1% lidocaine injected Incision made Nexplanon inserted Dressed and bandaged  Followup her postpartum exam at the end of the month

## 2013-04-13 NOTE — Patient Instructions (Addendum)

## 2013-04-27 ENCOUNTER — Ambulatory Visit (INDEPENDENT_AMBULATORY_CARE_PROVIDER_SITE_OTHER): Payer: BC Managed Care – PPO | Admitting: Advanced Practice Midwife

## 2013-04-27 ENCOUNTER — Encounter: Payer: Self-pay | Admitting: Advanced Practice Midwife

## 2013-04-27 NOTE — Progress Notes (Signed)
Valerie Rasmussen is a 22 y.o. who presents for a postpartum visit. She is 6 weeks postpartum following a spontaneous vaginal delivery. I have fully reviewed the prenatal and intrapartum course. The delivery was at 37.4 gestational weeks.SHe was induced for GHTN.  She did not require meds upon discharge.  Anesthesia: epidural. Postpartum course has been uneventful.  She received her Nexplanon a few weeks ago and is not having any problems.. Baby's course has been uneventful. Baby is feeding by bottle. Bleeding: staining only. Bowel function is normal. Bladder function is normal. Patient is sexually active. Postpartum depression screening: negative.    Review of Systems   Constitutional: Negative for fever and chills Eyes: Negative for visual disturbances Respiratory: Negative for shortness of breath, dyspnea Cardiovascular: Negative for chest pain or palpitations  Gastrointestinal: Negative for vomiting, diarrhea and constipation Genitourinary: Negative for dysuria and urgency Musculoskeletal: Negative for back pain, joint pain, myalgias  Neurological: Negative for dizziness and headaches   Objective:     Filed Vitals:   04/27/13 1508  BP: 128/80   General:  alert, cooperative and no distress   Breasts:  negative  Lungs: clear to auscultation bilaterally  Heart:  regular rate and rhythm  Abdomen: Soft, nontender   Vulva:  normal  Vagina: normal vagina  Cervix:  closed  Corpus: Well involuted     Rectal Exam: no hemorrhoids        Assessment:    normal postpartum exam.  Plan:    1. Contraception: Nexplanon 2. Follow up in:  as needed.

## 2013-12-06 ENCOUNTER — Encounter (HOSPITAL_COMMUNITY): Payer: Self-pay | Admitting: Emergency Medicine

## 2013-12-06 DIAGNOSIS — Z791 Long term (current) use of non-steroidal anti-inflammatories (NSAID): Secondary | ICD-10-CM | POA: Insufficient documentation

## 2013-12-06 DIAGNOSIS — Z87891 Personal history of nicotine dependence: Secondary | ICD-10-CM | POA: Insufficient documentation

## 2013-12-06 DIAGNOSIS — K029 Dental caries, unspecified: Secondary | ICD-10-CM | POA: Insufficient documentation

## 2013-12-06 DIAGNOSIS — Z79899 Other long term (current) drug therapy: Secondary | ICD-10-CM | POA: Insufficient documentation

## 2013-12-06 DIAGNOSIS — K089 Disorder of teeth and supporting structures, unspecified: Secondary | ICD-10-CM | POA: Insufficient documentation

## 2013-12-06 NOTE — ED Notes (Signed)
C/O L. Sided toothache.

## 2013-12-07 ENCOUNTER — Emergency Department (HOSPITAL_COMMUNITY)
Admission: EM | Admit: 2013-12-07 | Discharge: 2013-12-07 | Disposition: A | Discharge status: Home / Self Care | Payer: BC Managed Care – PPO | Attending: Emergency Medicine | Admitting: Emergency Medicine

## 2013-12-07 DIAGNOSIS — K0889 Other specified disorders of teeth and supporting structures: Secondary | ICD-10-CM

## 2013-12-07 DIAGNOSIS — K029 Dental caries, unspecified: Secondary | ICD-10-CM

## 2013-12-07 MED ORDER — HYDROCODONE-ACETAMINOPHEN 5-325 MG PO TABS: 2.0000 | ORAL_TABLET | ORAL | Status: DC | PRN

## 2013-12-07 MED ORDER — PENICILLIN V POTASSIUM 500 MG PO TABS: 1000.0000 mg | ORAL_TABLET | Freq: Two times a day (BID) | ORAL | Status: DC

## 2013-12-07 MED ORDER — HYDROCODONE-ACETAMINOPHEN 5-325 MG PO TABS
ORAL_TABLET | ORAL | Status: AC
  Administered 2013-12-07: 2
  Filled 2013-12-07: qty 2

## 2013-12-07 NOTE — Discharge Instructions (Signed)
You have been seen by your caregiver because of dental pain.  SEEK MEDICAL ATTENTION IF: The exam and treatment you received today has been provided on an emergency basis only. This is not a substitute for complete medical or dental care. If your problem worsens or new symptoms (problems) appear, and you are unable to arrange prompt follow-up care with your dentist, call or return to this location. CALL YOUR DENTIST OR RETURN IMMEDIATELY IF you develop a fever, rash, difficulty breathing or swallowing, neck or facial swelling, or other potentially serious concerns.    Emergency Department Resource Guide 1) Find a Doctor and Pay Out of Pocket Although you won't have to find out who is covered by your insurance plan, it is a good idea to ask around and get recommendations. You will then need to call the office and see if the doctor you have chosen will accept you as a new patient and what types of options they offer for patients who are self-pay. Some doctors offer discounts or will set up payment plans for their patients who do not have insurance, but you will need to ask so you aren't surprised when you get to your appointment.  2) Contact Your Local Health Department Not all health departments have doctors that can see patients for sick visits, but many do, so it is worth a call to see if yours does. If you don't know where your local health department is, you can check in your phone book. The CDC also has a tool to help you locate your state's health department, and many state websites also have listings of all of their local health departments.  3) Find a Walk-in Clinic If your illness is not likely to be very severe or complicated, you may want to try a walk in clinic. These are popping up all over the country in pharmacies, drugstores, and shopping centers. They're usually staffed by nurse practitioners or physician assistants that have been trained to treat common illnesses and complaints. They're  usually fairly quick and inexpensive. However, if you have serious medical issues or chronic medical problems, these are probably not your best option.  No Primary Care Doctor: - Call Health Connect at  [phone] - they can help you locate a primary care doctor that  accepts your insurance, provides certain services, etc. - Physician Referral Service- [phone]  Chronic Pain Problems: Organization         Address  Phone   Notes  [facility]  [phone] Patients need to be referred by their primary care doctor.   Medication Assistance: Organization         Address  Phone   Notes  [facility] Medication [facility] [address] [place], [place] [address] [phone] --Must be a resident of [place] -- Must have NO insurance coverage whatsoever (no Medicaid/ Medicare, etc.) -- The pt. MUST have a primary care doctor that directs their care regularly and follows them in the community   MedAssist  [phone]   [employer]  [phone]    Agencies that provide inexpensive medical care: Organization         Address  Phone   Notes  [facility]  [phone]   [facility]    [phone]   [facility] [address] [place], [place] [address] [phone]   [facility] [address], [place] [phone]  [facility]anned Parenthood    [phone]   [facility]    [phone]   [facility] and [facility]  [address], [place] Phone:  [phone], Fax:  [phone] Hours of Operation:  9 am - 6 pm, M-F.  Also accepts Medicaid/Medicare and self-pay.  [facility]  [address], [place] Phone: [phone], Fax: [phone]. Hours of Operation:  8:30 am - 5:30 pm, M-F.  Also accepts Medicaid and self-pay.  [facility] [place] [address] [place] Phone:  [phone]   [facility] [address] Natasha Bence [place], [place] [phone], Ext. [phone] Mondays & Thursdays: 7-9 AM.  First 15 patients are seen on a first come, first serve basis.    Medicaid-accepting [facility] Providers:  Organization         Address  Phone   Notes  [facility] [address],  [phone] Also accepts self-pay patients.  [facility] [address] Laurell Josephs [place], [place]  [phone]   [facility] [address], [place] [phone]   [facility] [address], [place] [phone]   Renaye Rakers [address], [place]   [phone] Only accepts [place] Access [place] patients after they have their name applied to their card.   Self-Pay (no insurance) in [facility]:  Organization         Address  Phone   Notes  Sickle Cell Patients, [facility] [address] [place], [place] [phone]   [facility] [address] [place], [place] [phone]   [facility]  [address], [place] [phone]   [facility]/Dr. Osei-Bonsu  [address] [place] or [address], [place] [phone] Phone number for both [place] and [place] locations is the same.  Urgent Medical and [facility] [address], [place] [phone]   [facility] [address] [place] or [address] [phone]   [facility] [address] [place] [phone], phone; [phone], fax Sees patients 1st and 3rd Saturday of every month.  Must not qualify for public or private insurance (i.e. Medicaid, Medicare, [place] Health Choice, Veterans' Benefits)  Household income should be no more than 200% of the poverty level The clinic cannot treat  you if you are pregnant or think you are pregnant  Sexually transmitted diseases are not treated at the clinic.   Dental Care: Organization         Address  Phone  Notes  [facility] Department of [facility] [address] [place], [place] [phone] Accepts children up to age 34 who are enrolled in [place] or [place] Health Choice; pregnant women with a Medicaid card; and children who have applied for Medicaid or [place] Health Choice, but were declined, whose parents can pay a reduced fee at time of service.  [facility] of [facility]  [address], [place] [phone] Accepts children up to age 64 who are enrolled in [place] or [place] Health Choice; pregnant women with a Medicaid card; and children who have applied for Medicaid or [place] Health Choice, but were declined, whose  parents can pay a reduced fee at time of service.  [facility]  [address] [place], [place] [phone] Patients are seen by appointment only. Walk-ins are not accepted. [facility] will see patients 65 years of age and older. Monday - Tuesday (8am-5pm) Most Wednesdays (8:30-5pm) $30 per visit, cash only  [facility] Adult Dental Access PROGRAM  [address], [facility] [phone] Patients are seen by appointment only. Walk-ins are not accepted. [facility] will see patients 11 years of age and older. One Wednesday Evening (Monthly: Volunteer Based).  $30 per visit, cash only  [employer]  [phone] for adults; Children under age 108, call Graduate Pediatric Dentistry at [phone]. Children aged 62-14, please call [phone] to request a pediatric application.  Dental services are provided in all areas of dental care including fillings, crowns and bridges, complete and partial dentures, implants, gum treatment, root canals, and extractions. Preventive care is also provided. Treatment is  provided to both adults and children. Patients are selected via a lottery and there is often a waiting list.   [facility] [address], [place]  [phone] [email or web address]   [facility] [address] [place], [place] [phone], Ext. [phone] Second and Fourth Thursday of each month, opens at 6:30 AM; Clinic ends at 9 AM.  Patients are seen on a first-come first-served basis, and a limited number are seen during each clinic.   [facility]  [address] Ether Griffins [place], [place] [phone]   Eligibility Requirements You must have lived in [place], [place], or [place] counties for at least the last three months.   You cannot be eligible for state or federal sponsored [place], including [employer], [place], or [employer].   You generally cannot be eligible for healthcare insurance through your employer.    How to apply: Eligibility screenings are held every Tuesday and Wednesday afternoon from 1:00 pm until 4:00 pm. You do not need an appointment for the interview!  [facility] [address], [place], [place] [phone]   [facility] Health Department  [phone]   [facility] Health Department  [phone]   [facility] Health Department  [phone]    Behavioral Health Resources in the Community: Intensive Outpatient Programs Organization         Address  Phone  Notes  [facility] Services [address], [place], [place] [phone]   [facility] Outpatient [address], [place], [place] [phone]   [facility]: Alcohol & Drug Svcs [address], [place], [place]  [phone]   [facility] Mental Health [address],  [place], [place] [phone] or [phone]   Substance Abuse Resources Organization         Address  Phone  Notes  Alcohol and Drug Services  [phone]   [facility]  [phone]   The  [place]  [phone]   Floydene Flock  [phone]   Residential & Outpatient Substance Abuse Program  [phone]   Psychological Services Organization         Address  Phone  Notes  [facility] Behavioral Health  [phone]   [facility]  [phone]   [facility] Mental Health [address], [place] [phone] or [phone]    Mobile Crisis Teams Organization         Address  Phone  Notes  [facility], Mobile Crisis  Care Unit  [phone]   [facility] Psychotherapeutic Services  [address] [place], [place] [phone]   [facility] [address] [place] [phone]    Self-Help/Support Groups Organization         Address  Phone             Notes  [facility] [place] - variety of support groups  [phone]- I7437963 Call for more information  Narcotics Anonymous (NA), [facility]aring Services [address], [employer] [place]  2 meetings at this location   Statistician         Address  Phone  Notes  [facility] [address],    [place]  [phone]   [facility]  [address] [place] [address], [place], [place] [phone]   [facility] Treatment Facility [address] [place], [place] [phone] Admissions: 8am-3pm M-F  [facility] [address]    [place], [place] [phone]   [facility] [address] [place], [place], [place] [phone]   [facility] [address],  [place], [place] [phone]   [facility] - Intensive Outpatient [address]ure[address], [place], [place] [phone]   [facility] (Addiction Recovery Care [facility]oc[facility] [address] [place]  [place], [place] [phone] or [phone]   [facility] Services (RTS) [address], [place], [place] [phone] Accepts Medicaid  [facility] [address],  [place] [phone] Substance Abuse/Addiction Treatment     [facility] Organization         Address  Phone  Notes  [facility]  [phone]   Angie Fava, PhD [address] Ervin Knack [place], [place]   [phone] or [phone]   [facility] Behavioral   [address] [place], [place] [phone]   [facility] [address], [place], [place] [phone] Insurance/Medicaid/sponsorship through [facility] and Families [address]                                    [place], [place] [phone] Therapy/tele-psych/case  [facility] [address], [place] [phone]    Dr. Lolly Mustache  [phone]   [facility]  [employer] [facility] Dept. 1) [address], [place] 2) [address], [place] 3)  [address], [place] [phone]  [phone]   [facility] Child Abuse Hotline (579)569-8859 or 5347557561 (After Hours)

## 2013-12-07 NOTE — ED Provider Notes (Signed)
CSN: [id number]     Arrival date & time 12/06/13  2248 History   First MD Initiated Contact with Patient 12/07/13 0025     Chief Complaint  Patient presents with  . Dental Pain     (Consider location/radiation/quality/duration/timing/severity/associated sxs/prior Treatment) HPI 23 year old female with 2 days of left-sided lower dental pain worse with chewing; radiates towards her neck and towards her left ear; no fever no trauma no sore throat no cough no chest pain no shortness breath no abdominal pain no vomiting no neck pain or neck swelling; no treatment prior to arrival; pain is moderately severe. Past Medical History  Diagnosis Date  . Pregnant    Past Surgical History  Procedure Laterality Date  . No past surgeries     Family History  Problem Relation Age of Onset  . Hypertension Other   . Coronary artery disease Other   . Cancer Other   . Heart disease Other   . Glaucoma Mother   . Heart attack Father   . Cancer Maternal Aunt     breast  . Diabetes Maternal Uncle   . Heart attack Paternal Uncle    History  Substance Use Topics  . Smoking status: Former Smoker    Types: Cigarettes  . Smokeless tobacco: Never Used  . Alcohol Use: No   OB History   Grav Para Term Preterm Abortions TAB SAB Ect Mult Living   1 1 1  0 0 0 0 0 0 1     Review of Systems  10 Systems reviewed and are negative for acute change except as noted in the HPI.  Allergies  Review of patient's allergies indicates no known allergies.  Home Medications   Current Outpatient Rx  Name  Route  Sig  Dispense  Refill  . HYDROcodone-acetaminophen (NORCO) 5-325 MG per tablet   Oral   Take 2 tablets by mouth every 4 (four) hours as needed.   10 tablet   0   . HYDROcodone-acetaminophen (NORCO) 5-325 MG per tablet   Oral   Take 2 tablets by mouth every 4 (four) hours as needed. Dispense to go.   2 tablet   0   . ibuprofen (ADVIL,MOTRIN) 600 MG tablet   Oral   Take 1 tablet (600 mg total)  by mouth every 6 (six) hours.   30 tablet   0   . penicillin v potassium (VEETID) 500 MG tablet   Oral   Take 2 tablets (1,000 mg total) by mouth 2 (two) times daily. X 7 days   28 tablet   0   . Prenatal Vit-Fe Fumarate-FA (PRENATAL MULTIVITAMIN) TABS   Oral   Take 1 tablet by mouth daily.          BP 151/88  Pulse 88  Temp(Src) 98.1 F (36.7 C) (Oral)  Resp 24  Ht 5\' 7"  (1.702 m)  Wt 260 lb (117.935 kg)  BMI 40.71 kg/m2  SpO2 100%  LMP 11/10/2013 Physical Exam  Nursing note and vitals reviewed. Constitutional:  Awake, alert, nontoxic appearance.  HENT:  Head: Atraumatic.  Mouth/Throat: Oropharynx is clear and moist.  Multiple dental caries tooth #19 is a large dental carry and is tender to percussion with localized gingival tenderness without gingival swelling or purulent drainage; there is no trismus no drooling no stridor no airway compromise and tongue appears normal  Eyes: Right eye exhibits no discharge. Left eye exhibits no discharge.  Neck: Neck supple.  Cardiovascular: Normal rate and regular rhythm.  No murmur heard. Pulmonary/Chest: Effort normal and breath sounds normal. No stridor. No respiratory distress. She has no wheezes. She has no rales. She exhibits no tenderness.  Abdominal: Soft. Bowel sounds are normal. She exhibits no distension. There is no tenderness. There is no rebound.  Musculoskeletal: She exhibits no tenderness.  Baseline ROM, no obvious new focal weakness.  Lymphadenopathy:    She has no cervical adenopathy.  Neurological:  Mental status and motor strength appears baseline for patient and situation.  Skin: No rash noted.  Psychiatric: She has a normal mood and affect.    ED Course  Procedures (including critical care time) Patient informed of clinical course, understand medical decision-making process, and agree with plan. Labs Review Labs Reviewed - No data to display Imaging Review No results found.   EKG  Interpretation None      MDM   Final diagnoses:  Pain, dental  Dental cavity    I doubt any other EMC precluding discharge at this time including, but not necessarily limited to the following:deep space neck infection.    Hurman HornJohn M Manraj Yeo, MD 12/08/13 [phone]

## 2014-07-09 ENCOUNTER — Encounter (HOSPITAL_COMMUNITY): Payer: Self-pay | Admitting: Emergency Medicine

## 2014-08-16 ENCOUNTER — Encounter (HOSPITAL_COMMUNITY): Payer: Self-pay | Admitting: Emergency Medicine

## 2014-08-16 ENCOUNTER — Emergency Department (HOSPITAL_COMMUNITY)
Admission: EM | Admit: 2014-08-16 | Discharge: 2014-08-16 | Disposition: A | Discharge status: Home / Self Care | Payer: BC Managed Care – PPO | Attending: Emergency Medicine | Admitting: Emergency Medicine

## 2014-08-16 DIAGNOSIS — Z791 Long term (current) use of non-steroidal anti-inflammatories (NSAID): Secondary | ICD-10-CM | POA: Diagnosis not present

## 2014-08-16 DIAGNOSIS — Z87891 Personal history of nicotine dependence: Secondary | ICD-10-CM | POA: Diagnosis not present

## 2014-08-16 DIAGNOSIS — K088 Other specified disorders of teeth and supporting structures: Secondary | ICD-10-CM | POA: Insufficient documentation

## 2014-08-16 DIAGNOSIS — K0889 Other specified disorders of teeth and supporting structures: Secondary | ICD-10-CM

## 2014-08-16 MED ORDER — PENICILLIN V POTASSIUM 500 MG PO TABS: 500.0000 mg | ORAL_TABLET | Freq: Four times a day (QID) | ORAL | Status: AC

## 2014-08-16 MED ORDER — HYDROCODONE-ACETAMINOPHEN 5-325 MG PO TABS
1.0000 | ORAL_TABLET | Freq: Once | ORAL | Status: AC
  Administered 2014-08-16: 1 via ORAL
  Filled 2014-08-16: qty 1

## 2014-08-16 MED ORDER — HYDROCODONE-ACETAMINOPHEN 5-325 MG PO TABS: 1.0000 | ORAL_TABLET | Freq: Four times a day (QID) | ORAL | Status: DC | PRN

## 2014-08-16 NOTE — Discharge Instructions (Signed)
Follow up with a dentist

## 2014-08-16 NOTE — ED Provider Notes (Signed)
CSN: [id number]     Arrival date & time 08/16/14  0212 History   First MD Initiated Contact with Patient 08/16/14 [phone]     Chief Complaint  Patient presents with  . Dental Pain     (Consider location/radiation/quality/duration/timing/severity/associated sxs/prior Treatment) Patient is a 23 y.o. female presenting with tooth pain. The history is provided by the patient (the pt complains of a toothache).  Dental Pain Location:  Lower Lower teeth location: left lower molar. Quality:  Dull Severity:  Moderate Onset quality:  Sudden Timing:  Constant Progression:  Waxing and waning Associated symptoms: no congestion and no headaches     Past Medical History  Diagnosis Date  . Pregnant    Past Surgical History  Procedure Laterality Date  . No past surgeries     Family History  Problem Relation Age of Onset  . Hypertension Other   . Coronary artery disease Other   . Cancer Other   . Heart disease Other   . Glaucoma Mother   . Heart attack Father   . Cancer Maternal Aunt     breast  . Diabetes Maternal Uncle   . Heart attack Paternal Uncle    History  Substance Use Topics  . Smoking status: Former Smoker    Types: Cigarettes  . Smokeless tobacco: Never Used  . Alcohol Use: No   OB History    Gravida Para Term Preterm AB TAB SAB Ectopic Multiple Living   1 1 1  0 0 0 0 0 0 1     Review of Systems  Constitutional: Negative for appetite change and fatigue.  HENT: Negative for congestion, ear discharge and sinus pressure.   Eyes: Negative for discharge.  Respiratory: Negative for cough.   Cardiovascular: Negative for chest pain.  Gastrointestinal: Negative for abdominal pain and diarrhea.  Genitourinary: Negative for frequency and hematuria.  Musculoskeletal: Negative for back pain.  Skin: Negative for rash.  Neurological: Negative for seizures and headaches.  Psychiatric/Behavioral: Negative for hallucinations.      Allergies  Review of patient's allergies  indicates no known allergies.  Home Medications   Prior to Admission medications   Medication Sig Start Date End Date Taking? Authorizing Provider  HYDROcodone-acetaminophen (NORCO/VICODIN) 5-325 MG per tablet Take 1 tablet by mouth every 6 (six) hours as needed. 08/16/14   Benny LennertJoseph L Fionn Stracke, MD  ibuprofen (ADVIL,MOTRIN) 600 MG tablet Take 1 tablet (600 mg total) by mouth every 6 (six) hours. 03/20/13   Leona SingletonMaria T Thekkekandam, MD  penicillin v potassium (VEETID) 500 MG tablet Take 1 tablet (500 mg total) by mouth 4 (four) times daily. 08/16/14 08/23/14  Benny LennertJoseph L Kimiko Common, MD  Prenatal Vit-Fe Fumarate-FA (PRENATAL MULTIVITAMIN) TABS Take 1 tablet by mouth daily.    Historical Provider, MD   BP 131/72 mmHg  Pulse 84  Temp(Src) 98 F (36.7 C)  Resp 17  Ht 5\' 7"  (1.702 m)  Wt 260 lb (117.935 kg)  BMI 40.71 kg/m2  SpO2 100%  LMP 07/29/2014 Physical Exam  Constitutional: She is oriented to person, place, and time. She appears well-developed.  HENT:  Head: Normocephalic.  Tender left lower molar  Eyes: Conjunctivae are normal.  Neck: No tracheal deviation present.  Cardiovascular:  No murmur heard. Musculoskeletal: Normal range of motion.  Neurological: She is oriented to person, place, and time.  Skin: Skin is warm.  Psychiatric: She has a normal mood and affect.    ED Course  Procedures (including critical care time) Labs Review Labs Reviewed -  No data to display  Imaging Review No results found.   EKG Interpretation None      MDM   Final diagnoses:  Toothache       Benny LennertJoseph L Addalyne Vandehei, MD 08/16/14 [phone]

## 2014-08-16 NOTE — ED Notes (Signed)
Pt c/o dental pain x 2 days that radiates into neck.

## 2017-10-05 ENCOUNTER — Encounter (HOSPITAL_COMMUNITY): Payer: Self-pay | Admitting: Emergency Medicine

## 2017-10-05 ENCOUNTER — Other Ambulatory Visit: Payer: Self-pay

## 2017-10-05 ENCOUNTER — Emergency Department (HOSPITAL_COMMUNITY)
Admission: EM | Admit: 2017-10-05 | Discharge: 2017-10-05 | Disposition: A | Discharge status: Home / Self Care | Payer: BLUE CROSS/BLUE SHIELD | Attending: Emergency Medicine | Admitting: Emergency Medicine

## 2017-10-05 DIAGNOSIS — K0889 Other specified disorders of teeth and supporting structures: Secondary | ICD-10-CM | POA: Diagnosis present

## 2017-10-05 DIAGNOSIS — Z79899 Other long term (current) drug therapy: Secondary | ICD-10-CM | POA: Diagnosis not present

## 2017-10-05 DIAGNOSIS — Z87891 Personal history of nicotine dependence: Secondary | ICD-10-CM | POA: Diagnosis not present

## 2017-10-05 MED ORDER — AMOXICILLIN 500 MG PO CAPS: 500.0000 mg | ORAL_CAPSULE | Freq: Three times a day (TID) | ORAL | 0 refills | Status: DC

## 2017-10-05 MED ORDER — NAPROXEN 500 MG PO TABS: 500.0000 mg | ORAL_TABLET | Freq: Two times a day (BID) | ORAL | 0 refills | Status: DC

## 2017-10-05 NOTE — ED Provider Notes (Signed)
[facility] EMERGENCY DEPARTMENT Provider Note   CSN: [id number] Arrival date & time: 10/05/17  [id number]     History   Chief Complaint Chief Complaint  Patient presents with  . Dental Pain    HPI Valerie Rasmussen is a 27 y.o. female since the emergency department today with right lower dental pain for the past 4 days.  Patient describes the pain as a constant throbbing that is a 10 out of 10 in severity.  Pain radiates to R ear. Pain is worse with chewing and with cold, minimal improvement with Tylenol and topical numbing medicine.  Patient states that she has 2 broken tooth that have been present for an extended period of time.  An appointment scheduled with a dentist for next week.  Denies fever, chills, or neck stiffness.  HPI  Past Medical History:  Diagnosis Date  . Pregnant     Patient Active Problem List   Diagnosis Date Noted  . Left breast mass 01/10/2013    Past Surgical History:  Procedure Laterality Date  . NO PAST SURGERIES      OB History    Gravida Para Term Preterm AB Living   1 1 1  0 0 1   SAB TAB Ectopic Multiple Live Births   0 0 0 0 1       Home Medications    Prior to Admission medications   Medication Sig Start Date End Date Taking? Authorizing Provider  HYDROcodone-acetaminophen (NORCO/VICODIN) 5-325 MG per tablet Take 1 tablet by mouth every 6 (six) hours as needed. 08/16/14   Bethann BerkshireZammit, Joseph, MD  ibuprofen (ADVIL,MOTRIN) 600 MG tablet Take 1 tablet (600 mg total) by mouth every 6 (six) hours. 03/20/13   Leona Singletonhekkekandam, Maria T, MD  Prenatal Vit-Fe Fumarate-FA (PRENATAL MULTIVITAMIN) TABS Take 1 tablet by mouth daily.    [provider]    Family History Family History  Problem Relation Age of Onset  . Glaucoma Mother   . Hypertension Other   . Coronary artery disease Other   . Cancer Other   . Heart disease Other   . Heart attack Father   . Cancer Maternal Aunt        breast  . Diabetes Maternal Uncle   . Heart attack  Paternal Uncle     Social History Social History   Tobacco Use  . Smoking status: Former Smoker    Types: Cigarettes  . Smokeless tobacco: Never Used  Substance Use Topics  . Alcohol use: No  . Drug use: No    Comment: not now     Allergies   Patient has no known allergies.   Review of Systems Review of Systems  Constitutional: Negative for chills and fever.  HENT: Positive for dental problem and ear pain (R). Negative for congestion, drooling, rhinorrhea, sore throat, trouble swallowing and voice change.   Respiratory: Negative for shortness of breath.   Musculoskeletal: Negative for neck stiffness.    Physical Exam Updated Vital Signs BP (!) 144/92 (BP Location: Right Arm)   Pulse 78   Temp 97.9 F (36.6 C) (Oral)   Resp 16   LMP 09/28/2017   SpO2 100%   Physical Exam  Constitutional: She appears well-developed and well-nourished.  Non-toxic appearance. No distress.  HENT:  Head: Normocephalic and atraumatic.  Right Ear: Tympanic membrane is not perforated, not erythematous, not retracted and not bulging.  Left Ear: Tympanic membrane is not perforated, not erythematous, not retracted and not bulging.  Nose: Nose normal.  Mouth/Throat: Uvula is midline and oropharynx is clear and moist. No uvula swelling. No oropharyngeal exudate, posterior oropharyngeal erythema or tonsillar abscesses.  Patient is tolerating her own secretions without difficulty.  No drooling.  No hot potato voice. No trismus.  Submandibular compartment is soft. She has poor dentition throughout.  There are multiple cracked teeth as well as multiple teeth decayed down to the gumline.  Right lower 2 most posterior molars are tender to palpation, cracked, with dental caries.  Gingiva surrounding these 2 teeth is erythematous and tender to palpation.  There is no palpable fluctuance.  No appreciable abscess.  Eyes: Conjunctivae are normal. Right eye exhibits no discharge. Left eye exhibits no  discharge.  Neck: Normal range of motion. Neck supple.  Lymphadenopathy:    She has no cervical adenopathy.  Neurological: She is alert.  Clear speech.   Psychiatric: She has a normal mood and affect. Her behavior is normal. Thought content normal.  Nursing note and vitals reviewed.   ED Treatments / Results  Labs (all labs ordered are listed, but only abnormal results are displayed) Labs Reviewed - No data to display  EKG  EKG Interpretation None      Radiology No results found.  Procedures Procedures (including critical care time)  Medications Ordered in ED Medications - No data to display   Initial Impression / Assessment and Plan / ED Course  I have reviewed the triage vital signs and the nursing notes.  Pertinent labs & imaging results that were available during my care of the patient were reviewed by me and considered in my medical decision making (see chart for details).    Patient presents with dental pain. Patient is nontoxic appearing with vitals WNL other than elevated BP- no indication of HTN emergency- discussed with patient need for recheck.  No gross abscess.  Exam unconcerning for Ludwig's angina or spread of infection.  Will treat with Amoxicillin and Naproxen.  Urged patient to follow-up with dentist, she has an appointment for next week, additional dental resources were provided.  Discussed treatment plan and need for follow up as well as return precautions. Provided opportunity for questions, patient confirmed understanding and is agreeable to plan.   Final Clinical Impressions(s) / ED Diagnoses   Final diagnoses:  Pain, dental    ED Discharge Orders        Ordered    naproxen (NAPROSYN) 500 MG tablet  2 times daily     10/05/17 1034    amoxicillin (AMOXIL) 500 MG capsule  3 times daily     10/05/17 [address], [place], PA-C 10/05/17 1037    Maia Plan, MD 10/05/17 1946

## 2017-10-05 NOTE — Discharge Instructions (Signed)
Call one of the dentists offices provided to schedule an appointment for re-evaluation and further management within the next 48 hours.   I have prescribed you amoxicillin which is an antibiotic to treat the infection and Naproxen which is an anti-inflammatory medicine to treat the pain.   Please take all of your antibiotics until finished. You may develop abdominal discomfort or diarrhea from the antibiotic.  You may help offset this with probiotics which you can buy at the store (ask your pharmacist if unable to find) or get probiotics in the form of eating yogurt. Do not eat or take the probiotics until 2 hours after your antibiotic. If you are unable to tolerate these side effects follow-up with your primary care provider or return to the emergency department.   If you begin to experience any blistering, rashes, swelling, or difficulty breathing seek medical care for evaluation of potentially more serious side effects.   Be sure to eat something when taking the Naproxen as it can cause stomach upset and at worst stomach bleeding. Do not take additional non steroidal anti-inflammatory medicines such as Ibuprofen, Aleve, or Advil while taking Naproxen. You may supplement with Tylenol.   Please be aware that this medications may interact with other medications you are taking, please be sure to discuss your medication list with your pharmacist. If you are taking birth control the antibiotic will deactivate your birth control for 2 weeks.   If you start to experience and new or worsening symptoms return to the emergency department. If you start to experience fever, chills, neck stiffness/pain, or inability to move your neck come back to the emergency department immediately.    Additionally your blood pressure was somewhat elevated in the emergency department today, have this rechecked by your primary care provider in 1 week.  Vitals:   10/05/17 0946  BP: (!) 144/92  Pulse: 78  Resp: 16  Temp:  97.9 F (36.6 C)  SpO2: 100%    If you do not have a primary care provider please see the list of options below:  [facility] Doctor List    Kari BaarsEdward Hawkins MD. Specialty: Pulmonary Disease Contact information: [address]  [address]  [place] [address]  [phone]   Syliva OvermanMargaret Simpson, MD. Specialty: [facility] Contact information: [address]  [place] [address]  [phone]   Lilyan PuntScott Luking, MD. Specialty: Family Medicine Contact information: [address]  [address]  [place] [address]  [phone]   Avon Gullyesfaye Fanta, MD Specialty: Internal Medicine Contact information: [address]  [place] [address]  [phone]   Catalina PizzaZach Hall, MD. Specialty: Internal Medicine Contact information: [address]  [place] [address]  [phone]    [facility] (Dr. Selena BattenKim) Specialty: Family Medicine Contact information: [address]  [place] [address]  [phone]   John GiovanniStephen Knowlton, MD. Specialty: [facility] Contact information: [address]  [address]  [place] [address]  [phone]   Carylon Perchesoy Fagan, MD. Specialty: Internal Medicine Contact information: [address]  [address]  [place] [address]  [phone]    [facility]  [address] [place] [address] [phone]  Services [facility] offers a variety of basic health services.  Services include but are not limited to: Blood pressure checks  Heart rate checks  Blood sugar checks  Urine analysis  Rapid strep tests  Pregnancy tests.  Health education and referrals  People needing more complex services will be directed to a physician online. Using these virtual visits, doctors can evaluate and prescribe medicine and treatments. There will be no medication on-site, though [facility] will help patients fill their prescriptions at  little to no cost.   For More information please go to: [email or web address]

## 2017-10-05 NOTE — ED Triage Notes (Signed)
Toothache for three days. Pt called this am for dentist appt. Cant be seen until next Thursday.

## 2020-07-28 ENCOUNTER — Encounter (HOSPITAL_COMMUNITY): Payer: Self-pay | Admitting: Emergency Medicine

## 2020-07-28 ENCOUNTER — Other Ambulatory Visit: Payer: Self-pay

## 2020-07-28 ENCOUNTER — Emergency Department (HOSPITAL_COMMUNITY)
Admission: EM | Admit: 2020-07-28 | Discharge: 2020-07-28 | Disposition: A | Discharge status: Home / Self Care | Payer: BLUE CROSS/BLUE SHIELD | Attending: Emergency Medicine | Admitting: Emergency Medicine

## 2020-07-28 DIAGNOSIS — F17210 Nicotine dependence, cigarettes, uncomplicated: Secondary | ICD-10-CM | POA: Insufficient documentation

## 2020-07-28 DIAGNOSIS — M5431 Sciatica, right side: Secondary | ICD-10-CM

## 2020-07-28 DIAGNOSIS — M5441 Lumbago with sciatica, right side: Secondary | ICD-10-CM | POA: Insufficient documentation

## 2020-07-28 LAB — URINALYSIS, ROUTINE W REFLEX MICROSCOPIC
Bilirubin Urine: NEGATIVE
Glucose, UA: NEGATIVE mg/dL
Ketones, ur: NEGATIVE mg/dL
Leukocytes,Ua: NEGATIVE
Nitrite: NEGATIVE
Protein, ur: 30 mg/dL — AB
Specific Gravity, Urine: 1.031 — ABNORMAL HIGH (ref 1.005–1.030)
pH: 6 (ref 5.0–8.0)

## 2020-07-28 LAB — PREGNANCY, URINE: Preg Test, Ur: NEGATIVE

## 2020-07-28 MED ORDER — PREDNISONE 10 MG PO TABS: ORAL_TABLET | ORAL | 0 refills | Status: DC

## 2020-07-28 MED ORDER — TIZANIDINE HCL 4 MG PO CAPS: 4.0000 mg | ORAL_CAPSULE | Freq: Three times a day (TID) | ORAL | 0 refills | Status: DC

## 2020-07-28 MED ORDER — PREDNISONE 50 MG PO TABS
60.0000 mg | ORAL_TABLET | Freq: Once | ORAL | Status: AC
  Administered 2020-07-28: 60 mg via ORAL
  Filled 2020-07-28: qty 1

## 2020-07-28 NOTE — ED Triage Notes (Signed)
Pt c/o lower back pain that started yesterday. Pt states she is having a hard time bending and taking care of ADL.

## 2020-07-28 NOTE — Discharge Instructions (Addendum)
Take your next dose of prednisone tomorrow evening.  Avoid lifting,  Bending,  Twisting or any other activity that worsens your pain over the next week.  Apply a heating pad to your lower back for 20 minutes 2-3 times daily.  You should get rechecked if your symptoms are not better over the next 5 days,  Or you develop increased pain,  Weakness in your leg(s) or loss of bladder or bowel function - these are symptoms of a worse injury.

## 2020-07-29 NOTE — ED Provider Notes (Signed)
[facility] EMERGENCY DEPARTMENT Provider Note   CSN: [id number] Arrival date & time: 07/28/20  1530     History Chief Complaint  Patient presents with  . Back Pain    Valerie Rasmussen is a 29 y.o. female presenting with right lower back pain similar to previous problems with sciatica (started when pregnant with her last child) which flared yesterday.  She denies obvious triggers but lifts up to 30-40 lb boxes with her job frequently.  There is radiation of pain through her right buttock and thigh, ending just below her knee.  There has been no weakness or numbness in the lower extremities and no urinary or bowel retention or incontinence.  Patient does not have a history of cancer or IVDU.  The patient took tylenol 975 and her aunt gave her 2 zanaflex tablets before arrival and she has had improvement in her symptoms.      HPI     Past Medical History:  Diagnosis Date  . Pregnant     Patient Active Problem List   Diagnosis Date Noted  . Left breast mass 01/10/2013    Past Surgical History:  Procedure Laterality Date  . NO PAST SURGERIES       OB History    Gravida  1   Para  1   Term  1   Preterm  0   AB  0   Living  1     SAB  0   TAB  0   Ectopic  0   Multiple  0   Live Births  1           Family History  Problem Relation Age of Onset  . Glaucoma Mother   . Hypertension Other   . Coronary artery disease Other   . Cancer Other   . Heart disease Other   . Heart attack Father   . Cancer Maternal Aunt        breast  . Diabetes Maternal Uncle   . Heart attack Paternal Uncle     Social History   Tobacco Use  . Smoking status: Light Tobacco Smoker    Types: Cigarettes  . Smokeless tobacco: Never Used  . Tobacco comment: drinks with alcohol  Vaping Use  . Vaping Use: Never used  Substance Use Topics  . Alcohol use: No    Comment: special occassions -liquor  . Drug use: No    Types: Marijuana    Comment: not now    Home  Medications Prior to Admission medications   Medication Sig Start Date End Date Taking? Authorizing Provider  amoxicillin (AMOXIL) 500 MG capsule Take 1 capsule (500 mg total) by mouth 3 (three) times daily. Patient not taking: Reported on 07/28/2020 10/05/17   Petrucelli, Samantha R, PA-C  diphenhydramine-acetaminophen (TYLENOL PM) 25-500 MG TABS tablet Take 1 tablet by mouth at bedtime as needed. Patient not taking: Reported on 07/28/2020    [provider]  HYDROcodone-acetaminophen (NORCO/VICODIN) 5-325 MG per tablet Take 1 tablet by mouth every 6 (six) hours as needed. Patient not taking: Reported on 07/28/2020 08/16/14   Bethann Berkshire, MD  ibuprofen (ADVIL,MOTRIN) 600 MG tablet Take 1 tablet (600 mg total) by mouth every 6 (six) hours. Patient not taking: Reported on 07/28/2020 03/20/13   Leona Singleton, MD  naproxen (NAPROSYN) 500 MG tablet Take 1 tablet (500 mg total) by mouth 2 (two) times daily. Patient not taking: Reported on 07/28/2020 10/05/17   Petrucelli, Pleas Koch, PA-C  predniSONE (DELTASONE) 10 MG tablet Take 6 tablets day one, 5 tablets day two, 4 tablets day three, 3 tablets day four, 2 tablets day five, then 1 tablet day six 07/28/20   Burgess Amor, PA-C  Prenatal Vit-Fe Fumarate-FA (PRENATAL MULTIVITAMIN) TABS Take 1 tablet by mouth daily. Patient not taking: Reported on 07/28/2020    [provider]  tiZANidine (ZANAFLEX) 4 MG capsule Take 1 capsule (4 mg total) by mouth 3 (three) times daily. 07/28/20   Burgess Amor, PA-C    Allergies    Patient has no known allergies.  Review of Systems   Review of Systems  Constitutional: Negative for fever.  Respiratory: Negative for shortness of breath.   Cardiovascular: Negative for chest pain and leg swelling.  Gastrointestinal: Negative for abdominal distention, abdominal pain and constipation.  Genitourinary: Negative for difficulty urinating, dysuria, flank pain, frequency and urgency.   Musculoskeletal: Positive for back pain. Negative for gait problem and joint swelling.  Skin: Negative for rash.  Neurological: Negative for weakness and numbness.  All other systems reviewed and are negative.   Physical Exam Updated Vital Signs BP 138/75   Pulse 75   Temp 98.5 F (36.9 C) (Oral)   Resp 14   Ht 5\' 6"  (1.676 m)   Wt 113.4 kg   LMP 07/27/2020 (Approximate)   SpO2 100%   BMI 40.35 kg/m   Physical Exam Vitals and nursing note reviewed.  Constitutional:      Appearance: She is well-developed.  HENT:     Head: Normocephalic.  Eyes:     Conjunctiva/sclera: Conjunctivae normal.  Cardiovascular:     Rate and Rhythm: Normal rate.     Comments: Pedal pulses normal. Pulmonary:     Effort: Pulmonary effort is normal.  Abdominal:     General: Bowel sounds are normal. There is no distension.     Palpations: Abdomen is soft. There is no mass.  Musculoskeletal:        General: Normal range of motion.     Cervical back: Normal range of motion and neck supple.     Lumbar back: Tenderness present. No swelling, edema or spasms.  Skin:    General: Skin is warm and dry.  Neurological:     Mental Status: She is alert.     Sensory: No sensory deficit.     Motor: No tremor or atrophy.     Gait: Gait normal.     Deep Tendon Reflexes:     Reflex Scores:      Patellar reflexes are 2+ on the right side and 2+ on the left side.      Achilles reflexes are 2+ on the right side and 2+ on the left side.    Comments: No strength deficit noted in hip and knee flexor and extensor muscle groups.  Ankle flexion and extension intact.     ED Results / Procedures / Treatments   Labs (all labs ordered are listed, but only abnormal results are displayed) Labs Reviewed  URINALYSIS, ROUTINE W REFLEX MICROSCOPIC - Abnormal; Notable for the following components:      Result Value   Color, Urine AMBER (*)    APPearance HAZY (*)    Specific Gravity, Urine 1.031 (*)    Hgb urine  dipstick MODERATE (*)    Protein, ur 30 (*)    Bacteria, UA RARE (*)    All other components within normal limits  PREGNANCY, URINE    EKG None  Radiology No results found.  Procedures Procedures (including critical care time)  Medications Ordered in ED Medications  predniSONE (DELTASONE) tablet 60 mg (60 mg Oral Given 07/28/20 1804)    ED Course  I have reviewed the triage vital signs and the nursing notes.  Pertinent labs & imaging results that were available during my care of the patient were reviewed by me and considered in my medical decision making (see chart for details).    MDM Rules/Calculators/A&P                          No neuro deficit on exam or by history to suggest emergent or surgical presentation. No signs of cauda equina.  Discussed worsened sx that should prompt immediate re-evaluation including distal weakness, bowel/bladder retention/incontinence.   Final Clinical Impression(s) / ED Diagnoses Final diagnoses:  Sciatica of right side    Rx / DC Orders ED Discharge Orders         Ordered    predniSONE (DELTASONE) 10 MG tablet        07/28/20 1804    tiZANidine (ZANAFLEX) 4 MG capsule  3 times daily        07/28/20 1804           Burgess Amor, Cordelia Poche 07/29/20 1212    Vanetta Mulders, MD 08/13/20 1511

## 2021-01-19 ENCOUNTER — Encounter: Payer: Self-pay | Admitting: Emergency Medicine

## 2021-01-19 ENCOUNTER — Other Ambulatory Visit: Payer: Self-pay

## 2021-01-19 ENCOUNTER — Ambulatory Visit
Admission: EM | Admit: 2021-01-19 | Discharge: 2021-01-19 | Disposition: A | Discharge status: Home / Self Care | Payer: BC Managed Care – PPO | Attending: Family Medicine | Admitting: Family Medicine

## 2021-01-19 DIAGNOSIS — K047 Periapical abscess without sinus: Secondary | ICD-10-CM

## 2021-01-19 MED ORDER — AMOXICILLIN-POT CLAVULANATE 875-125 MG PO TABS: 1.0000 | ORAL_TABLET | Freq: Two times a day (BID) | ORAL | 0 refills | Status: AC

## 2021-01-19 NOTE — ED Triage Notes (Signed)
States she has a broken tooth on left side of face.  Pain radiates to left ear and down left side of neck.

## 2021-01-19 NOTE — ED Provider Notes (Signed)
[facility]    CSN: [id number] Arrival date & time: 01/19/21  1111      History   Chief Complaint No chief complaint on file.   HPI Valerie Rasmussen is a 30 y.o. female.   Reports that she has 3 broken teeth to the left lower jaw.  Reports that the area has been very painful for the last 24 hours.  Reports that the pain goes from her left lower jaw, to her left ear and down the left side of her neck.  Reports she is taking ibuprofen and Tylenol with temporary pain relief.  Does not see a dentist regularly.  Denies chills, fever, abdominal pain, nausea, vomiting, diarrhea, rash, other symptoms.  ROS per HPI  The history is provided by the patient.    Past Medical History:  Diagnosis Date  . Pregnant     Patient Active Problem List   Diagnosis Date Noted  . Left breast mass 01/10/2013    Past Surgical History:  Procedure Laterality Date  . NO PAST SURGERIES      OB History    Gravida  1   Para  1   Term  1   Preterm  0   AB  0   Living  1     SAB  0   IAB  0   Ectopic  0   Multiple  0   Live Births  1            Home Medications    Prior to Admission medications   Medication Sig Start Date End Date Taking? Authorizing Provider  amoxicillin-clavulanate (AUGMENTIN) 875-125 MG tablet Take 1 tablet by mouth 2 (two) times daily for 10 days. 01/19/21 01/29/21 Yes Moshe Cipro, NP  diphenhydramine-acetaminophen (TYLENOL PM) 25-500 MG TABS tablet Take 1 tablet by mouth at bedtime as needed. Patient not taking: Reported on 07/28/2020    [provider]  HYDROcodone-acetaminophen (NORCO/VICODIN) 5-325 MG per tablet Take 1 tablet by mouth every 6 (six) hours as needed. Patient not taking: Reported on 07/28/2020 08/16/14   Bethann Berkshire, MD  ibuprofen (ADVIL,MOTRIN) 600 MG tablet Take 1 tablet (600 mg total) by mouth every 6 (six) hours. Patient not taking: Reported on 07/28/2020 03/20/13   Leona Singleton, MD   naproxen (NAPROSYN) 500 MG tablet Take 1 tablet (500 mg total) by mouth 2 (two) times daily. Patient not taking: Reported on 07/28/2020 10/05/17   Petrucelli, Pleas Koch, PA-C  predniSONE (DELTASONE) 10 MG tablet Take 6 tablets day one, 5 tablets day two, 4 tablets day three, 3 tablets day four, 2 tablets day five, then 1 tablet day six 07/28/20   Burgess Amor, PA-C  Prenatal Vit-Fe Fumarate-FA (PRENATAL MULTIVITAMIN) TABS Take 1 tablet by mouth daily. Patient not taking: Reported on 07/28/2020    [provider]  tiZANidine (ZANAFLEX) 4 MG capsule Take 1 capsule (4 mg total) by mouth 3 (three) times daily. 07/28/20   Burgess Amor, PA-C    Family History Family History  Problem Relation Age of Onset  . Glaucoma Mother   . Hypertension Other   . Coronary artery disease Other   . Cancer Other   . Heart disease Other   . Heart attack Father   . Cancer Maternal Aunt        breast  . Diabetes Maternal Uncle   . Heart attack Paternal Uncle     Social History Social History   Tobacco Use  . Smoking status: Light  Tobacco Smoker    Types: Cigarettes  . Smokeless tobacco: Never Used  . Tobacco comment: drinks with alcohol  Vaping Use  . Vaping Use: Never used  Substance Use Topics  . Alcohol use: No    Comment: special occassions -liquor  . Drug use: No    Types: Marijuana    Comment: not now     Allergies   Patient has no known allergies.   Review of Systems Review of Systems   Physical Exam Triage Vital Signs ED Triage Vitals  Enc Vitals Group     BP 01/19/21 1121 (!) 142/92     Pulse Rate 01/19/21 1121 75     Resp 01/19/21 1121 18     Temp 01/19/21 1121 98 F (36.7 C)     Temp Source 01/19/21 1121 Oral     SpO2 01/19/21 1121 98 %     Weight --      Height --      Head Circumference --      Peak Flow --      Pain Score 01/19/21 1119 9     Pain Loc --      Pain Edu? --      Excl. in GC? --    No data found.  Updated Vital Signs BP (!) 142/92 (BP  Location: Right Arm)   Pulse 75   Temp 98 F (36.7 C) (Oral)   Resp 18   LMP 01/14/2021   SpO2 98%       Physical Exam Vitals and nursing note reviewed.  Constitutional:      General: She is not in acute distress.    Appearance: She is well-developed. She is not ill-appearing.  HENT:     Head: Normocephalic and atraumatic.     Right Ear: Tympanic membrane, ear canal and external ear normal.     Left Ear: Tympanic membrane, ear canal and external ear normal.     Nose: Nose normal.     Mouth/Throat:     Mouth: Mucous membranes are moist.     Dentition: Abnormal dentition. Dental tenderness, gingival swelling and dental caries present.     Pharynx: Oropharynx is clear.      Comments: Area of missing teeth, erythema, tenderness Eyes:     Extraocular Movements: Extraocular movements intact.     Conjunctiva/sclera: Conjunctivae normal.     Pupils: Pupils are equal, round, and reactive to light.  Cardiovascular:     Rate and Rhythm: Normal rate and regular rhythm.     Heart sounds: No murmur heard.   Pulmonary:     Effort: Pulmonary effort is normal. No respiratory distress.     Breath sounds: Normal breath sounds.  Abdominal:     Palpations: Abdomen is soft.     Tenderness: There is no abdominal tenderness.  Musculoskeletal:     Cervical back: Neck supple.  Skin:    General: Skin is warm and dry.  Neurological:     Mental Status: She is alert.      UC Treatments / Results  Labs (all labs ordered are listed, but only abnormal results are displayed) Labs Reviewed - No data to display  EKG   Radiology No results found.  Procedures Procedures (including critical care time)  Medications Ordered in UC Medications - No data to display  Initial Impression / Assessment and Plan / UC Course  I have reviewed the triage vital signs and the nursing notes.  Pertinent labs & imaging results that  were available during my care of the patient were reviewed by me and  considered in my medical decision making (see chart for details).    Dental infection  Augmentin 875 twice daily prescribed for 10 days May take 800 mg ibuprofen with 1000 mg of Tylenol.  Do not exceed 4000 mg of Tylenol in 24 hours. Follow-up with dentistry Follow up with this office or with primary care if symptoms are persisting. Follow up in the ER for high fever, trouble swallowing, trouble breathing, other concerning symptoms.    Final Clinical Impressions(s) / UC Diagnoses   Final diagnoses:  Dental infection     Discharge Instructions     I have sent in Augmentin for you to take twice a day for 10 days.  May take 800 mg ibuprofen with 1000 mg of Tylenol.  Do not exceed 4000 mg of Tylenol in 24 hours.  Follow up with this office or with primary care if symptoms are persisting.  Follow up in the ER for high fever, trouble swallowing, trouble breathing, other concerning symptoms.     ED Prescriptions    Medication Sig Dispense Auth. Provider   amoxicillin-clavulanate (AUGMENTIN) 875-125 MG tablet Take 1 tablet by mouth 2 (two) times daily for 10 days. 20 tablet Moshe Cipro, NP     PDMP not reviewed this encounter.   Moshe Cipro, NP 01/19/21 1129

## 2021-01-19 NOTE — Discharge Instructions (Signed)
I have sent in Augmentin for you to take twice a day for 10 days.  May take 800 mg ibuprofen with 1000 mg of Tylenol.  Do not exceed 4000 mg of Tylenol in 24 hours.  Follow up with this office or with primary care if symptoms are persisting.  Follow up in the ER for high fever, trouble swallowing, trouble breathing, other concerning symptoms.

## 2021-04-01 DIAGNOSIS — R5383 Other fatigue: Secondary | ICD-10-CM | POA: Diagnosis not present

## 2021-04-01 DIAGNOSIS — F17209 Nicotine dependence, unspecified, with unspecified nicotine-induced disorders: Secondary | ICD-10-CM | POA: Diagnosis not present

## 2021-04-01 DIAGNOSIS — M545 Low back pain, unspecified: Secondary | ICD-10-CM | POA: Diagnosis not present

## 2021-04-01 DIAGNOSIS — Z13220 Encounter for screening for lipoid disorders: Secondary | ICD-10-CM | POA: Diagnosis not present

## 2021-04-01 DIAGNOSIS — Z136 Encounter for screening for cardiovascular disorders: Secondary | ICD-10-CM | POA: Diagnosis not present

## 2021-04-01 DIAGNOSIS — Z1159 Encounter for screening for other viral diseases: Secondary | ICD-10-CM | POA: Diagnosis not present

## 2021-04-01 DIAGNOSIS — M549 Dorsalgia, unspecified: Secondary | ICD-10-CM | POA: Diagnosis not present

## 2021-04-01 DIAGNOSIS — Z975 Presence of (intrauterine) contraceptive device: Secondary | ICD-10-CM | POA: Diagnosis not present

## 2021-05-05 ENCOUNTER — Encounter: Payer: BC Managed Care – PPO | Admitting: Adult Health

## 2021-05-23 ENCOUNTER — Ambulatory Visit (INDEPENDENT_AMBULATORY_CARE_PROVIDER_SITE_OTHER): Payer: BC Managed Care – PPO | Admitting: Adult Health

## 2021-05-23 ENCOUNTER — Encounter: Payer: Self-pay | Admitting: Adult Health

## 2021-05-23 ENCOUNTER — Other Ambulatory Visit (HOSPITAL_COMMUNITY)
Admission: RE | Admit: 2021-05-23 | Discharge: 2021-05-23 | Disposition: A | Discharge status: Home / Self Care | Payer: BC Managed Care – PPO | Source: Ambulatory Visit | Attending: Adult Health | Admitting: Adult Health

## 2021-05-23 ENCOUNTER — Other Ambulatory Visit: Payer: Self-pay

## 2021-05-23 VITALS — BP 123/86 | HR 75 | Ht 66.0 in | Wt 264.5 lb

## 2021-05-23 DIAGNOSIS — Z30011 Encounter for initial prescription of contraceptive pills: Secondary | ICD-10-CM

## 2021-05-23 DIAGNOSIS — Z3202 Encounter for pregnancy test, result negative: Secondary | ICD-10-CM

## 2021-05-23 DIAGNOSIS — Z124 Encounter for screening for malignant neoplasm of cervix: Secondary | ICD-10-CM

## 2021-05-23 DIAGNOSIS — Z3046 Encounter for surveillance of implantable subdermal contraceptive: Secondary | ICD-10-CM | POA: Diagnosis not present

## 2021-05-23 LAB — POCT URINE PREGNANCY: Preg Test, Ur: NEGATIVE

## 2021-05-23 MED ORDER — NORETHIN ACE-ETH ESTRAD-FE 1-20 MG-MCG PO TABS: 1.0000 | ORAL_TABLET | Freq: Every day | ORAL | 11 refills | Status: DC

## 2021-05-23 NOTE — Progress Notes (Signed)
  Subjective:     Patient ID: Valerie Rasmussen, female   DOB: [birth year], 30 y.o.   MRN: [id number]  HPI Valerie Rasmussen is a 30 year old black female,single, G1P1 in for nexplanon removal, it has expired. She does not know when had pap last. PCP is in [place].  Review of Systems For nexplanon removal Patient denies any headaches, hearing loss, fatigue, blurred vision, shortness of breath, chest pain, abdominal pain, problems with bowel movements, urination, or intercourse. No joint pain or mood swings.    Reviewed past medical,surgical, social and family history. Reviewed medications and allergies.  Objective:   Physical Exam BP 123/86 (BP Location: Left Arm, Patient Position: Sitting, Cuff Size: Large)   Pulse 75   Ht 5\' 6"  (1.676 m)   Wt 264 lb 8 oz (120 kg)   LMP 05/19/2021   BMI 42.69 kg/m  UPT is negative. Consent signed, time out called. Left arm cleansed with betadine, and injected with 1.5 cc 2% lidocaine and waited til numb.Under sterile technique a #11 blade was used to make small vertical incision, and a curved forceps was used to easily remove rod. Steri strips applied. Pressure dressing applied.   Skin warm and dry.Pelvic: external genitalia is normal in appearance no lesions, vagina: +blood,urethra has no lesions or masses noted, cervix:smooth and bulbous, Pap with GC/CHL and HR HPV genotyping performed, uterus: normal size, shape and contour, non tender, no masses felt, adnexa: no masses or tenderness noted. Bladder is non tender and no masses  Fall risk is low  Upstream - 05/23/21 1103       Pregnancy Intention Screening   Does the patient want to become pregnant in the next year? Yes    Does the patient's partner want to become pregnant in the next year? Yes    Would the patient like to discuss contraceptive options today? Yes      Contraception Wrap Up   Current Method No Method - Other Reason    End Method Oral Contraceptive    Contraception Counseling Provided Yes               Examination chaperoned by 05/25/21 LPN    Assessment:     1. Pregnancy examination or test, negative result   2. Routine Papanicolaou smear Pap sent  3. Encounter for Nexplanon removal Use condoms x 4 weeks, keep clean and dry x 24 hours, no heavy lifting, keep steri strips on x 72 hours, Keep pressure dressing on x 24 hours. Follow up prn problems.   4. Encounter for initial prescription of contraceptive pills Can start OCs today,use condoms Meds ordered this encounter  Medications   norethindrone-ethinyl estradiol-FE (LOESTRIN FE) 1-20 MG-MCG tablet    Sig: Take 1 tablet by mouth daily.    Dispense:  28 tablet    Refill:  11    Order Specific Question:   Supervising Provider    Answer:   Malachy Mood [[id number]]       Plan:     Follow up in 3 months for BP check and ROS

## 2021-05-23 NOTE — Patient Instructions (Signed)
Use condoms x 4 weeks, keep clean and dry x 24 hours, no heavy lifting, keep steri strips on x 72 hours, Keep pressure dressing on x 24 hours. Follow up prn problems.  

## 2021-05-28 LAB — CYTOLOGY - PAP
Chlamydia: NEGATIVE
Comment: NEGATIVE
Comment: NEGATIVE
Comment: NORMAL
Diagnosis: NEGATIVE
High risk HPV: NEGATIVE
Neisseria Gonorrhea: NEGATIVE

## 2021-08-06 ENCOUNTER — Other Ambulatory Visit: Payer: Self-pay

## 2021-08-06 ENCOUNTER — Ambulatory Visit (INDEPENDENT_AMBULATORY_CARE_PROVIDER_SITE_OTHER): Payer: BC Managed Care – PPO

## 2021-08-06 VITALS — BP 124/79 | HR 85 | Ht 68.0 in | Wt 267.0 lb

## 2021-08-06 DIAGNOSIS — Z3201 Encounter for pregnancy test, result positive: Secondary | ICD-10-CM

## 2021-08-06 DIAGNOSIS — Z3200 Encounter for pregnancy test, result unknown: Secondary | ICD-10-CM

## 2021-08-06 LAB — POCT URINE PREGNANCY: Preg Test, Ur: POSITIVE — AB

## 2021-08-06 NOTE — Progress Notes (Addendum)
   NURSE VISIT- PREGNANCY CONFIRMATION   SUBJECTIVE:  Valerie Rasmussen is a 30 y.o. G18P1001 female at [redacted]w[redacted]d by certain LMP of Patient's last menstrual period was 06/22/2021 (approximate). Here for pregnancy confirmation.  Home pregnancy test: positive x 1   She reports nausea.  She is not taking prenatal vitamins.    OBJECTIVE:  BP 124/79 (BP Location: Right Arm, Patient Position: Sitting, Cuff Size: Large)   Pulse 85   Ht 5\' 8"  (1.727 m)   Wt 267 lb (121.1 kg)   LMP 06/22/2021 (Approximate)   BMI 40.60 kg/m   Appears well, in no apparent distress  Results for orders placed or performed in visit on 08/06/21 (from the past 24 hour(s))  POCT urine pregnancy   Collection Time: 08/06/21  9:58 AM  Result Value Ref Range   Preg Test, Ur Positive (A) Negative    ASSESSMENT: Positive pregnancy test, [redacted]w[redacted]d by LMP    PLAN: Schedule for dating ultrasound in 2-3 weeks Prenatal vitamins: plans to begin OTC ASAP   Nausea medicines: not currently needed   OB packet given: Yes  Adom Schoeneck A Burnett Spray  08/06/2021 10:02 AM   Chart reviewed for nurse visit. Agree with plan of care.  08/08/2021, CNM 08/06/2021 12:59 PM

## 2021-08-18 ENCOUNTER — Encounter: Payer: BC Managed Care – PPO | Admitting: Adult Health

## 2021-08-18 ENCOUNTER — Other Ambulatory Visit: Payer: Self-pay

## 2021-08-19 NOTE — Progress Notes (Signed)
This encounter was created in error - please disregard.

## 2021-08-22 ENCOUNTER — Other Ambulatory Visit: Payer: Self-pay | Admitting: Obstetrics & Gynecology

## 2021-08-22 DIAGNOSIS — O3680X0 Pregnancy with inconclusive fetal viability, not applicable or unspecified: Secondary | ICD-10-CM

## 2021-09-02 ENCOUNTER — Ambulatory Visit (INDEPENDENT_AMBULATORY_CARE_PROVIDER_SITE_OTHER): Payer: BC Managed Care – PPO

## 2021-09-02 ENCOUNTER — Other Ambulatory Visit: Payer: Self-pay

## 2021-09-02 DIAGNOSIS — Z3A10 10 weeks gestation of pregnancy: Secondary | ICD-10-CM | POA: Diagnosis not present

## 2021-09-02 DIAGNOSIS — O3680X0 Pregnancy with inconclusive fetal viability, not applicable or unspecified: Secondary | ICD-10-CM | POA: Diagnosis not present

## 2021-09-02 NOTE — Progress Notes (Signed)
[place] 11+6 wks,single IUP,CRL 51.73 mm,normal ovaries,FHR 169 bpm

## 2021-09-07 NOTE — L&D Delivery Note (Addendum)
Delivery Note While waiting for anterior lip of cervix to open, pt began Involuntarily pushing.  Then  baby delivered quickly, as I was putting on my gloves.    At 4:56 AM a viable and healthy female was delivered via Vaginal, Spontaneous (Presentation: Left Occiput Anterior).  APGAR: 9/9 ; weight  .   Placenta status: Spontaneous,  .  Cord: 3vessel cord  with the following complications:  Tight nuchal cord x 2, long cord.    Anesthesia: Epidural Episiotomy: None Lacerations: None Suture Repair:  none Est. Blood Loss (mL):  100  Mom to postpartum.  Baby to Couplet care / Skin to Skin.  [name] 02/26/2022, 5:12 AM

## 2021-09-22 DIAGNOSIS — Z3490 Encounter for supervision of normal pregnancy, unspecified, unspecified trimester: Secondary | ICD-10-CM | POA: Insufficient documentation

## 2021-09-22 DIAGNOSIS — Z8759 Personal history of other complications of pregnancy, childbirth and the puerperium: Secondary | ICD-10-CM | POA: Insufficient documentation

## 2021-09-22 DIAGNOSIS — O0990 Supervision of high risk pregnancy, unspecified, unspecified trimester: Secondary | ICD-10-CM | POA: Insufficient documentation

## 2021-09-24 ENCOUNTER — Other Ambulatory Visit: Payer: Self-pay

## 2021-09-24 ENCOUNTER — Ambulatory Visit (INDEPENDENT_AMBULATORY_CARE_PROVIDER_SITE_OTHER): Payer: BC Managed Care – PPO | Admitting: Advanced Practice Midwife

## 2021-09-24 ENCOUNTER — Encounter: Payer: Self-pay | Admitting: Advanced Practice Midwife

## 2021-09-24 ENCOUNTER — Ambulatory Visit: Payer: BC Managed Care – PPO | Admitting: *Deleted

## 2021-09-24 VITALS — BP 123/84 | HR 89 | Wt 270.0 lb

## 2021-09-24 DIAGNOSIS — Z3480 Encounter for supervision of other normal pregnancy, unspecified trimester: Secondary | ICD-10-CM | POA: Diagnosis not present

## 2021-09-24 DIAGNOSIS — Z31430 Encounter of female for testing for genetic disease carrier status for procreative management: Secondary | ICD-10-CM | POA: Diagnosis not present

## 2021-09-24 DIAGNOSIS — Z8759 Personal history of other complications of pregnancy, childbirth and the puerperium: Secondary | ICD-10-CM | POA: Diagnosis not present

## 2021-09-24 DIAGNOSIS — Z6841 Body Mass Index (BMI) 40.0 and over, adult: Secondary | ICD-10-CM

## 2021-09-24 DIAGNOSIS — Z3A15 15 weeks gestation of pregnancy: Secondary | ICD-10-CM

## 2021-09-24 DIAGNOSIS — O99210 Obesity complicating pregnancy, unspecified trimester: Secondary | ICD-10-CM

## 2021-09-24 DIAGNOSIS — Z3481 Encounter for supervision of other normal pregnancy, first trimester: Secondary | ICD-10-CM | POA: Diagnosis not present

## 2021-09-24 DIAGNOSIS — Z363 Encounter for antenatal screening for malformations: Secondary | ICD-10-CM

## 2021-09-24 LAB — POCT URINALYSIS DIPSTICK OB
Blood, UA: NEGATIVE
Glucose, UA: NEGATIVE
Ketones, UA: NEGATIVE
Leukocytes, UA: NEGATIVE
Nitrite, UA: NEGATIVE
POC,PROTEIN,UA: NEGATIVE

## 2021-09-24 MED ORDER — ASPIRIN 81 MG PO CHEW: 162.0000 mg | CHEWABLE_TABLET | Freq: Every day | ORAL | 7 refills | Status: DC

## 2021-09-24 NOTE — Progress Notes (Signed)
INITIAL OBSTETRICAL VISIT Patient name: Valerie Rasmussen MRN [id number]  Date of birth: 1991/06/10 Chief Complaint:   Initial Prenatal Visit  History of Present Illness:   Valerie Rasmussen is a 31 y.o. G31P1001 African-American female at [redacted]w[redacted]d by [place] at 11.6 weeks with an Estimated Date of Delivery: 03/18/22 being seen today for her initial obstetrical visit.   Patient's last menstrual period was 06/22/2021 (approximate). Her obstetrical history is significant for  gHTN, SVD 2014 .   Today she reports  doing better since nausea resolved .  Last pap Sept 2022. Results were: NILM w/ HRHPV negative  Depression screen [facility] 2/9 09/24/2021 05/23/2021  Decreased Interest 0 1  Down, Depressed, Hopeless 0 0  PHQ - 2 Score 0 1  Altered sleeping 0 0  Tired, decreased energy 1 1  Change in appetite 0 0  Feeling bad or failure about yourself  0 0  Trouble concentrating 0 0  Moving slowly or fidgety/restless 0 0  Suicidal thoughts 0 0  PHQ-9 Score 1 2     GAD 7 : Generalized Anxiety Score 09/24/2021 05/23/2021  Nervous, Anxious, on Edge 0 0  Control/stop worrying 0 0  Worry too much - different things 1 0  Trouble relaxing 0 0  Restless 0 0  Easily annoyed or irritable 0 1  Afraid - awful might happen 0 0  Total GAD 7 Score 1 1     Review of Systems:   Pertinent items are noted in HPI Denies cramping/contractions, leakage of fluid, vaginal bleeding, abnormal vaginal discharge w/ itching/odor/irritation, headaches, visual changes, shortness of breath, chest pain, abdominal pain, severe nausea/vomiting, or problems with urination or bowel movements unless otherwise stated above.  Pertinent History Reviewed:  Reviewed past medical,surgical, social, obstetrical and family history.  Reviewed problem list, medications and allergies. OB History  Gravida Para Term Preterm AB Living  2 1 1  0 0 1  SAB IAB Ectopic Multiple Live Births  0 0 0 0 1    # Outcome Date GA Lbr Len/2nd Weight Sex  Delivery Anes PTL Lv  2 Current           1 Term 03/18/13 [redacted]w[redacted]d / 00:07 5 lb 8 oz (2.495 kg) M Vag-Spont EPI N LIV     Complications: Gestational hypertension   Physical Assessment:   Vitals:   09/24/21 1011  BP: 123/84  Pulse: 89  Weight: 270 lb (122.5 kg)  Body mass index is 41.05 kg/m.       Physical Examination:  General appearance - well appearing, and in no distress  Mental status - alert, oriented to person, place, and time  Psych:  She has a normal mood and affect  Skin - warm and dry, normal color, no suspicious lesions noted  Chest - effort normal, all lung fields clear to auscultation bilaterally  Heart - normal rate and regular rhythm  Abdomen - soft, nontender; FHT 155 bpm  Extremities:  No swelling or varicosities noted  Pelvic - not indicated  Thin prep pap is not done    TODAY'S NT : declined  Results for orders placed or performed in visit on 09/24/21 (from the past 24 hour(s))  POC Urinalysis Dipstick OB   Collection Time: 09/24/21 10:59 AM  Result Value Ref Range   Color, UA     Clarity, UA     Glucose, UA Negative Negative   Bilirubin, UA     Ketones, UA neg    Spec Grav, UA  Blood, UA neg    pH, UA     POC,PROTEIN,UA Negative Negative, Trace, Small (1+), Moderate (2+), Large (3+), 4+   Urobilinogen, UA     Nitrite, UA neg    Leukocytes, UA Negative Negative   Appearance     Odor      Assessment & Plan:  1) Low-Risk Pregnancy G2P1001 at [redacted]w[redacted]d with an Estimated Date of Delivery: 03/18/22   2) Initial OB visit  3) Hx gHTN, rx bASA 162mg  daily; baseline labs collected  Meds:  Meds ordered this encounter  Medications   aspirin 81 MG chewable tablet    Sig: Chew 2 tablets (162 mg total) by mouth daily.    Dispense:  60 tablet    Refill:  7    Order Specific Question:   Supervising Provider    Answer:   Tania Ade H [[id number]]    Initial labs obtained Continue prenatal vitamins Reviewed n/v relief measures and warning s/s to  report Reviewed recommended weight gain based on pre-gravid BMI Encouraged well-balanced diet Genetic & carrier screening discussed: requests Panorama and Horizon , requests AFP Ultrasound discussed; fetal survey: requested CCNC completed> form faxed if has or is planning to apply for medicaid The nature of [place] for [employer] with multiple MDs and other Advanced Practice Providers was explained to patient; also emphasized that fellows, residents, and students are part of our team. Does not have home bp cuff. Office bp cuff given: will give at next visit (currently out). Rx sent: no. Check bp weekly, let us know if consistently >140/90.   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   Orders Placed This Encounter  Procedures   Urine Culture   GC/Chlamydia Probe Amp   US OB Comp + 14 Wk   AFP, Serum, Open Spina Bifida   Protein / creatinine ratio, urine   Comprehensive metabolic panel   CBC/D/Plt+RPR+Rh+ABO+RubIgG...   Genetic Screening   Hemoglobin A1c   POC Urinalysis Dipstick OB    Myrtis Ser CNM 09/24/2021 11:11 AM

## 2021-09-24 NOTE — Patient Instructions (Signed)
Valerie Rasmussen, thank you for choosing our office today! We appreciate the opportunity to meet your healthcare needs. You may receive a short survey by mail, e-mail, or through [employer]. If you are happy with your care we would appreciate if you could take just a few minutes to complete the survey questions. We read all of your comments and take your feedback very seriously. Thank you again for choosing our office.  Center for [employer] [facility] Team at [facility]  [facility] at [facility] ([address] [place], [place] [address]) Entrance C, located off of [address] Free 24/7 valet parking   Nausea & Vomiting Have saltine crackers or pretzels by your bed and eat a few bites before you raise your head out of bed in the morning Eat small frequent meals throughout the day instead of large meals Drink plenty of fluids throughout the day to stay hydrated, just don't drink a lot of fluids with your meals.  This can make your stomach fill up faster making you feel sick Do not brush your teeth right after you eat Products with real ginger are good for nausea, like ginger ale and ginger hard candy Make sure it says made with real ginger! Sucking on sour candy like lemon heads is also good for nausea If your prenatal vitamins make you nauseated, take them at night so you will sleep through the nausea Sea Bands If you feel like you need medicine for the nausea & vomiting please let us know If you are unable to keep any fluids or food down please let us know   Constipation Drink plenty of fluid, preferably water, throughout the day Eat foods high in fiber such as fruits, vegetables, and grains Exercise, such as walking, is a good way to keep your bowels regular Drink warm fluids, especially warm prune juice, or decaf coffee Eat a 1/2 cup of real oatmeal (not instant), 1/2 cup applesauce, and 1/2-1 cup warm prune juice every day If needed, you may take Colace (docusate sodium) stool softener  once or twice a day to help keep the stool soft.  If you still are having problems with constipation, you may take Miralax once daily as needed to help keep your bowels regular.   Home Blood Pressure Monitoring for Patients   Your provider has recommended that you check your blood pressure (BP) at least once a week at home. If you do not have a blood pressure cuff at home, one will be provided for you. Contact your provider if you have not received your monitor within 1 week.   Helpful Tips for Accurate Home Blood Pressure Checks  Don't smoke, exercise, or drink caffeine 30 minutes before checking your BP Use the restroom before checking your BP (a full bladder can raise your pressure) Relax in a comfortable upright chair Feet on the ground Left arm resting comfortably on a flat surface at the level of your heart Legs uncrossed Back supported Sit quietly and don't talk Place the cuff on your bare arm Adjust snuggly, so that only two fingertips can fit between your skin and the top of the cuff Check 2 readings separated by at least one minute Keep a log of your BP readings For a visual, please reference this diagram: http://ccnc.care/bpdiagram  Provider Name: [facility]     Phone: [phone]  Zone 1: ALL CLEAR  Continue to monitor your symptoms:  BP reading is less than 140 (top number) or less than 90 (bottom  number)  No right upper stomach pain No headaches or seeing spots No feeling nauseated or throwing up No swelling in face and hands  Zone 2: CAUTION Call your doctor's office for any of the following:  BP reading is greater than 140 (top number) or greater than 90 (bottom number)  Stomach pain under your ribs in the middle or right side Headaches or seeing spots Feeling nauseated or throwing up Swelling in face and hands  Zone 3: EMERGENCY  Seek immediate medical care if you have any of the following:  BP reading is greater than160 (top number) or greater than  110 (bottom number) Severe headaches not improving with Tylenol Serious difficulty catching your breath Any worsening symptoms from Zone 2    First Trimester of Pregnancy The first trimester of pregnancy is from week 1 until the end of week 12 (months 1 through 3). A week after a sperm fertilizes an egg, the egg will implant on the wall of the uterus. This embryo will begin to develop into a baby. Genes from you and your partner are forming the baby. The female genes determine whether the baby is a boy or a girl. At 6-8 weeks, the eyes and face are formed, and the heartbeat can be seen on ultrasound. At the end of 12 weeks, all the baby's organs are formed.  Now that you are pregnant, you will want to do everything you can to have a healthy baby. Two of the most important things are to get good prenatal care and to follow your health care provider's instructions. Prenatal care is all the medical care you receive before the baby's birth. This care will help prevent, find, and treat any problems during the pregnancy and childbirth. BODY CHANGES Your body goes through many changes during pregnancy. The changes vary from woman to woman.  You may gain or lose a couple of pounds at first. You may feel sick to your stomach (nauseous) and throw up (vomit). If the vomiting is uncontrollable, call your health care provider. You may tire easily. You may develop headaches that can be relieved by medicines approved by your health care provider. You may urinate more often. Painful urination may mean you have a bladder infection. You may develop heartburn as a result of your pregnancy. You may develop constipation because certain hormones are causing the muscles that push waste through your intestines to slow down. You may develop hemorrhoids or swollen, bulging veins (varicose veins). Your breasts may begin to grow larger and become tender. Your nipples may stick out more, and the tissue that surrounds them  (areola) may become darker. Your gums may bleed and may be sensitive to brushing and flossing. Dark spots or blotches (chloasma, mask of pregnancy) may develop on your face. This will likely fade after the baby is born. Your menstrual periods will stop. You may have a loss of appetite. You may develop cravings for certain kinds of food. You may have changes in your emotions from day to day, such as being excited to be pregnant or being concerned that something may go wrong with the pregnancy and baby. You may have more vivid and strange dreams. You may have changes in your hair. These can include thickening of your hair, rapid growth, and changes in texture. Some women also have hair loss during or after pregnancy, or hair that feels dry or thin. Your hair will most likely return to normal after your baby is born. WHAT TO EXPECT AT YOUR PRENATAL   VISITS During a routine prenatal visit: You will be weighed to make sure you and the baby are growing normally. Your blood pressure will be taken. Your abdomen will be measured to track your baby's growth. The fetal heartbeat will be listened to starting around week 10 or 12 of your pregnancy. Test results from any previous visits will be discussed. Your health care provider may ask you: How you are feeling. If you are feeling the baby move. If you have had any abnormal symptoms, such as leaking fluid, bleeding, severe headaches, or abdominal cramping. If you have any questions. Other tests that may be performed during your first trimester include: Blood tests to find your blood type and to check for the presence of any previous infections. They will also be used to check for low iron levels (anemia) and Rh antibodies. Later in the pregnancy, blood tests for diabetes will be done along with other tests if problems develop. Urine tests to check for infections, diabetes, or protein in the urine. An ultrasound to confirm the proper growth and development  of the baby. An amniocentesis to check for possible genetic problems. Fetal screens for spina bifida and Down syndrome. You may need other tests to make sure you and the baby are doing well. HOME CARE INSTRUCTIONS  Medicines Follow your health care provider's instructions regarding medicine use. Specific medicines may be either safe or unsafe to take during pregnancy. Take your prenatal vitamins as directed. If you develop constipation, try taking a stool softener if your health care provider approves. Diet Eat regular, well-balanced meals. Choose a variety of foods, such as meat or vegetable-based protein, fish, milk and low-fat dairy products, vegetables, fruits, and whole grain breads and cereals. Your health care provider will help you determine the amount of weight gain that is right for you. Avoid raw meat and uncooked cheese. These carry germs that can cause birth defects in the baby. Eating four or five small meals rather than three large meals a day may help relieve nausea and vomiting. If you start to feel nauseous, eating a few soda crackers can be helpful. Drinking liquids between meals instead of during meals also seems to help nausea and vomiting. If you develop constipation, eat more high-fiber foods, such as fresh vegetables or fruit and whole grains. Drink enough fluids to keep your urine clear or pale yellow. Activity and Exercise Exercise only as directed by your health care provider. Exercising will help you: Control your weight. Stay in shape. Be prepared for labor and delivery. Experiencing pain or cramping in the lower abdomen or low back is a good sign that you should stop exercising. Check with your health care provider before continuing normal exercises. Try to avoid standing for long periods of time. Move your legs often if you must stand in one place for a long time. Avoid heavy lifting. Wear low-heeled shoes, and practice good posture. You may continue to have sex  unless your health care provider directs you otherwise. Relief of Pain or Discomfort Wear a good support bra for breast tenderness.   Take warm sitz baths to soothe any pain or discomfort caused by hemorrhoids. Use hemorrhoid cream if your health care provider approves.   Rest with your legs elevated if you have leg cramps or low back pain. If you develop varicose veins in your legs, wear support hose. Elevate your feet for 15 minutes, 3-4 times a day. Limit salt in your diet. Prenatal Care Schedule your prenatal visits by the   twelfth week of pregnancy. They are usually scheduled monthly at first, then more often in the last 2 months before delivery. Write down your questions. Take them to your prenatal visits. Keep all your prenatal visits as directed by your health care provider. Safety Wear your seat belt at all times when driving. Make a list of emergency phone numbers, including numbers for family, friends, the hospital, and police and fire departments. General Tips Ask your health care provider for a referral to a local prenatal education class. Begin classes no later than at the beginning of month 6 of your pregnancy. Ask for help if you have counseling or nutritional needs during pregnancy. Your health care provider can offer advice or refer you to specialists for help with various needs. Do not use hot tubs, steam rooms, or saunas. Do not douche or use tampons or scented sanitary pads. Do not cross your legs for long periods of time. Avoid cat litter boxes and soil used by cats. These carry germs that can cause birth defects in the baby and possibly loss of the fetus by miscarriage or stillbirth. Avoid all smoking, herbs, alcohol, and medicines not prescribed by your health care provider. Chemicals in these affect the formation and growth of the baby. Schedule a dentist appointment. At home, brush your teeth with a soft toothbrush and be gentle when you floss. SEEK MEDICAL CARE IF:   You have dizziness. You have mild pelvic cramps, pelvic pressure, or nagging pain in the abdominal area. You have persistent nausea, vomiting, or diarrhea. You have a bad smelling vaginal discharge. You have pain with urination. You notice increased swelling in your face, hands, legs, or ankles. SEEK IMMEDIATE MEDICAL CARE IF:  You have a fever. You are leaking fluid from your vagina. You have spotting or bleeding from your vagina. You have severe abdominal cramping or pain. You have rapid weight gain or loss. You vomit blood or material that looks like coffee grounds. You are exposed to German measles and have never had them. You are exposed to fifth disease or chickenpox. You develop a severe headache. You have shortness of breath. You have any kind of trauma, such as from a fall or a car accident. Document Released: 08/18/2001 Document Revised: 01/08/2014 Document Reviewed: 07/04/2013 ExitCare Patient Information 2015 [employer]. This information is not intended to replace advice given to you by your health care provider. Make sure you discuss any questions you have with your health care provider.  

## 2021-09-26 ENCOUNTER — Telehealth: Payer: Self-pay | Admitting: Advanced Practice Midwife

## 2021-09-26 LAB — COMPREHENSIVE METABOLIC PANEL
ALT: 18 IU/L (ref 0–32)
AST: 16 IU/L (ref 0–40)
Albumin/Globulin Ratio: 1.8 (ref 1.2–2.2)
Albumin: 4.4 g/dL (ref 3.9–5.0)
Alkaline Phosphatase: 59 IU/L (ref 44–121)
BUN/Creatinine Ratio: 18 (ref 9–23)
BUN: 11 mg/dL (ref 6–20)
Bilirubin Total: 0.4 mg/dL (ref 0.0–1.2)
CO2: 19 mmol/L — ABNORMAL LOW (ref 20–29)
Calcium: 9.5 mg/dL (ref 8.7–10.2)
Chloride: 106 mmol/L (ref 96–106)
Creatinine, Ser: 0.6 mg/dL (ref 0.57–1.00)
Globulin, Total: 2.4 g/dL (ref 1.5–4.5)
Glucose: 76 mg/dL (ref 70–99)
Potassium: 4.6 mmol/L (ref 3.5–5.2)
Sodium: 139 mmol/L (ref 134–144)
Total Protein: 6.8 g/dL (ref 6.0–8.5)
eGFR: 124 mL/min/{1.73_m2} (ref >59)

## 2021-09-26 LAB — CBC/D/PLT+RPR+RH+ABO+RUBIGG...
Antibody Screen: NEGATIVE
Basophils Absolute: 0 10*3/uL (ref 0.0–0.2)
Basos: 0 %
EOS (ABSOLUTE): 0.1 10*3/uL (ref 0.0–0.4)
Eos: 1 %
HCV Ab: <0.1 s/co ratio (ref 0.0–0.9)
HIV Screen 4th Generation wRfx: NONREACTIVE
Hematocrit: 35.8 % (ref 34.0–46.6)
Hemoglobin: 12.4 g/dL (ref 11.1–15.9)
Hepatitis B Surface Ag: NEGATIVE
Immature Grans (Abs): 0 10*3/uL (ref 0.0–0.1)
Immature Granulocytes: 0 %
Lymphocytes Absolute: 2 10*3/uL (ref 0.7–3.1)
Lymphs: 22 %
MCH: 32.5 pg (ref 26.6–33.0)
MCHC: 34.6 g/dL (ref 31.5–35.7)
MCV: 94 fL (ref 79–97)
Monocytes Absolute: 0.6 10*3/uL (ref 0.1–0.9)
Monocytes: 6 %
Neutrophils Absolute: 6.5 10*3/uL (ref 1.4–7.0)
Neutrophils: 71 %
Platelets: 207 10*3/uL (ref 150–450)
RBC: 3.81 x10E6/uL (ref 3.77–5.28)
RDW: 12.8 % (ref 11.7–15.4)
RPR Ser Ql: NONREACTIVE
Rh Factor: POSITIVE
Rubella Antibodies, IGG: 1.69 index (ref >0.99)
WBC: 9.2 10*3/uL (ref 3.4–10.8)

## 2021-09-26 LAB — AFP, SERUM, OPEN SPINA BIFIDA
AFP MoM: 0.83
AFP Value: 19.9 ng/mL
Gest. Age on Collection Date: 15 weeks
Maternal Age At EDD: 31.2 yr
OSBR Risk 1 IN: 10000
Test Results:: NEGATIVE
Weight: 270 [lb_av]

## 2021-09-26 LAB — PROTEIN / CREATININE RATIO, URINE
Creatinine, Urine: 115.4 mg/dL
Protein, Ur: 10.5 mg/dL
Protein/Creat Ratio: 91 mg/g creat (ref 0–200)

## 2021-09-26 LAB — GC/CHLAMYDIA PROBE AMP
Chlamydia trachomatis, NAA: NEGATIVE
Neisseria Gonorrhoeae by PCR: NEGATIVE

## 2021-09-26 LAB — HEMOGLOBIN A1C
Est. average glucose Bld gHb Est-mCnc: 103 mg/dL
Hgb A1c MFr Bld: 5.2 % (ref 4.8–5.6)

## 2021-09-26 LAB — HCV INTERPRETATION

## 2021-09-26 NOTE — Telephone Encounter (Signed)
Pt wants to continue taking OTC prenatal vit. The Aspirin script was sent on 1/18 but [facility] stated they didn't get it. I left Aspirin script info on VM @ 10:11 am @ [facility] in [place]. JSY

## 2021-09-26 NOTE — Telephone Encounter (Signed)
Patient calling stating that [facility] in [place] doesn't have her script for her prenatals and the aspirin she is wanting to know if it is going to be sent .

## 2021-09-27 LAB — URINE CULTURE

## 2021-10-14 ENCOUNTER — Encounter: Payer: Self-pay | Admitting: Advanced Practice Midwife

## 2021-10-28 ENCOUNTER — Other Ambulatory Visit: Payer: Self-pay

## 2021-10-28 ENCOUNTER — Encounter: Payer: Self-pay | Admitting: Obstetrics & Gynecology

## 2021-10-28 ENCOUNTER — Ambulatory Visit (INDEPENDENT_AMBULATORY_CARE_PROVIDER_SITE_OTHER): Payer: BC Managed Care – PPO | Admitting: Obstetrics & Gynecology

## 2021-10-28 ENCOUNTER — Ambulatory Visit (INDEPENDENT_AMBULATORY_CARE_PROVIDER_SITE_OTHER): Payer: BC Managed Care – PPO

## 2021-10-28 VITALS — BP 115/77 | HR 83 | Wt 278.0 lb

## 2021-10-28 DIAGNOSIS — Z363 Encounter for antenatal screening for malformations: Secondary | ICD-10-CM | POA: Diagnosis not present

## 2021-10-28 DIAGNOSIS — O99210 Obesity complicating pregnancy, unspecified trimester: Secondary | ICD-10-CM

## 2021-10-28 DIAGNOSIS — Z3480 Encounter for supervision of other normal pregnancy, unspecified trimester: Secondary | ICD-10-CM

## 2021-10-28 DIAGNOSIS — Z3402 Encounter for supervision of normal first pregnancy, second trimester: Secondary | ICD-10-CM

## 2021-10-28 NOTE — Progress Notes (Signed)
° °  LOW-RISK PREGNANCY VISIT Patient name: Valerie Rasmussen MRN [id number]  Date of birth: 1991/05/28 Chief Complaint:   Routine Prenatal Visit  History of Present Illness:   AAYAH Rasmussen is a 31 y.o. G23P1001 female at [redacted]w[redacted]d with an Estimated Date of Delivery: 03/18/22 being seen today for ongoing management of a low-risk pregnancy.  Depression screen [facility] 2/9 09/24/2021 05/23/2021  Decreased Interest 0 1  Down, Depressed, Hopeless 0 0  PHQ - 2 Score 0 1  Altered sleeping 0 0  Tired, decreased energy 1 1  Change in appetite 0 0  Feeling bad or failure about yourself  0 0  Trouble concentrating 0 0  Moving slowly or fidgety/restless 0 0  Suicidal thoughts 0 0  PHQ-9 Score 1 2    Today she reports no complaints. Contractions: Not present. Vag. Bleeding: None.  Movement: Present. denies leaking of fluid. Review of Systems:   Pertinent items are noted in HPI Denies abnormal vaginal discharge w/ itching/odor/irritation, headaches, visual changes, shortness of breath, chest pain, abdominal pain, severe nausea/vomiting, or problems with urination or bowel movements unless otherwise stated above. Pertinent History Reviewed:  Reviewed past medical,surgical, social, obstetrical and family history.  Reviewed problem list, medications and allergies.  Physical Assessment:   Vitals:   10/28/21 1132  BP: 115/77  Pulse: 83  Weight: 278 lb (126.1 kg)  Body mass index is 42.27 kg/m.        Physical Examination:   General appearance: Well appearing, and in no distress  Mental status: Alert, oriented to person, place, and time  Skin: Warm & dry  Respiratory: Normal respiratory effort, no distress  Abdomen: Soft, gravid, nontender  Pelvic: Cervical exam deferred         Extremities: Edema: None  Psych:  mood and affect appropriate  Fetal Status:     Movement: Present    19+6 wks,breech,fhr 144 bpm,anterior placenta gr 0,SVP of fluid 6.2 cm,normal ovaries,EFW 308 g 37%,anatomy  complete,no obvious abnormalities   Chaperone: n/a    No results found for this or any previous visit (from the past 24 hour(s)).   Assessment & Plan:  1) Low-risk pregnancy G2P1001 at [redacted]w[redacted]d with an Estimated Date of Delivery: 03/18/22    Meds: No orders of the defined types were placed in this encounter.  Labs/procedures today: anatomy scan  Plan:  Continue routine obstetrical care  Next visit: prefers in person    Reviewed: Preterm labor symptoms and general obstetric precautions including but not limited to vaginal bleeding, contractions, leaking of fluid and fetal movement were reviewed in detail with the patient.  All questions were answered. Pt given home bp cuff. Check bp weekly, let us know if >140/90.   Follow-up: Return in about 4 weeks (around 11/25/2021) for [place] visit.  No orders of the defined types were placed in this encounter.   Janyth Pupa, DO Attending [place], [facility] for [employer], [place]

## 2021-10-28 NOTE — Progress Notes (Signed)
[place] 19+6 wks,breech,fhr 144 bpm,anterior placenta gr 0,SVP of fluid 6.2 cm,normal ovaries,EFW 308 g 37%,anatomy complete,no obvious abnormalities

## 2021-11-04 ENCOUNTER — Other Ambulatory Visit: Payer: Self-pay

## 2021-11-04 ENCOUNTER — Encounter: Payer: Self-pay | Admitting: Obstetrics & Gynecology

## 2021-11-04 ENCOUNTER — Ambulatory Visit (INDEPENDENT_AMBULATORY_CARE_PROVIDER_SITE_OTHER): Payer: BC Managed Care – PPO | Admitting: Obstetrics & Gynecology

## 2021-11-04 ENCOUNTER — Encounter (HOSPITAL_COMMUNITY): Payer: Self-pay | Admitting: Emergency Medicine

## 2021-11-04 ENCOUNTER — Emergency Department (HOSPITAL_COMMUNITY)
Admission: EM | Admit: 2021-11-04 | Discharge: 2021-11-04 | Disposition: A | Discharge status: Home / Self Care | Payer: BC Managed Care – PPO | Attending: Emergency Medicine | Admitting: Emergency Medicine

## 2021-11-04 VITALS — BP 123/82 | HR 97 | Wt 282.0 lb

## 2021-11-04 DIAGNOSIS — O26892 Other specified pregnancy related conditions, second trimester: Secondary | ICD-10-CM | POA: Diagnosis not present

## 2021-11-04 DIAGNOSIS — Z3A20 20 weeks gestation of pregnancy: Secondary | ICD-10-CM | POA: Insufficient documentation

## 2021-11-04 DIAGNOSIS — R1032 Left lower quadrant pain: Secondary | ICD-10-CM | POA: Diagnosis not present

## 2021-11-04 DIAGNOSIS — N200 Calculus of kidney: Secondary | ICD-10-CM

## 2021-11-04 DIAGNOSIS — R1012 Left upper quadrant pain: Secondary | ICD-10-CM

## 2021-11-04 DIAGNOSIS — Z3402 Encounter for supervision of normal first pregnancy, second trimester: Secondary | ICD-10-CM

## 2021-11-04 LAB — URINALYSIS, ROUTINE W REFLEX MICROSCOPIC
Bilirubin Urine: NEGATIVE
Glucose, UA: NEGATIVE mg/dL
Hgb urine dipstick: NEGATIVE
Ketones, ur: 80 mg/dL — AB
Leukocytes,Ua: NEGATIVE
Nitrite: NEGATIVE
Protein, ur: NEGATIVE mg/dL
Specific Gravity, Urine: 1.021 (ref 1.005–1.030)
pH: 5 (ref 5.0–8.0)

## 2021-11-04 LAB — CBC WITH DIFFERENTIAL/PLATELET
Abs Immature Granulocytes: 0.06 10*3/uL (ref 0.00–0.07)
Basophils Absolute: 0 10*3/uL (ref 0.0–0.1)
Basophils Relative: 0 %
Eosinophils Absolute: 0.1 10*3/uL (ref 0.0–0.5)
Eosinophils Relative: 1 %
HCT: 30.6 % — ABNORMAL LOW (ref 36.0–46.0)
Hemoglobin: 10.7 g/dL — ABNORMAL LOW (ref 12.0–15.0)
Immature Granulocytes: 1 %
Lymphocytes Relative: 21 %
Lymphs Abs: 2.2 10*3/uL (ref 0.7–4.0)
MCH: 33.9 pg (ref 26.0–34.0)
MCHC: 35 g/dL (ref 30.0–36.0)
MCV: 96.8 fL (ref 80.0–100.0)
Monocytes Absolute: 0.6 10*3/uL (ref 0.1–1.0)
Monocytes Relative: 5 %
Neutro Abs: 7.9 10*3/uL — ABNORMAL HIGH (ref 1.7–7.7)
Neutrophils Relative %: 72 %
Platelets: 190 10*3/uL (ref 150–400)
RBC: 3.16 MIL/uL — ABNORMAL LOW (ref 3.87–5.11)
RDW: 14 % (ref 11.5–15.5)
WBC: 10.8 10*3/uL — ABNORMAL HIGH (ref 4.0–10.5)
nRBC: 0 % (ref 0.0–0.2)

## 2021-11-04 LAB — BASIC METABOLIC PANEL
Anion gap: 7 (ref 5–15)
BUN: 11 mg/dL (ref 6–20)
CO2: 21 mmol/L — ABNORMAL LOW (ref 22–32)
Calcium: 9.1 mg/dL (ref 8.9–10.3)
Chloride: 104 mmol/L (ref 98–111)
Creatinine, Ser: 0.6 mg/dL (ref 0.44–1.00)
GFR, Estimated: >60 mL/min (ref >60)
Glucose, Bld: 87 mg/dL (ref 70–99)
Potassium: 4.1 mmol/L (ref 3.5–5.1)
Sodium: 132 mmol/L — ABNORMAL LOW (ref 135–145)

## 2021-11-04 NOTE — ED Provider Notes (Signed)
[facility] EMERGENCY DEPARTMENT Provider Note   CSN: [id number] Arrival date & time: 11/04/21  0134     History  Chief Complaint  Patient presents with   Abdominal Pain    Valerie Rasmussen is a 31 y.o. female.  Patient is a 31 year old female G 2P1001 [redacted] weeks gestation.  She presents today with complaints of left-sided abdominal pain.  This started at earlier this afternoon.  She describes a crampy pain to the left abdomen with no associated nausea, vomiting, constipation, or diarrhea.  She denies any fevers or chills.  She denies any vaginal bleeding.  The history is provided by the patient.  Abdominal Pain Pain location:  L flank, LUQ and LLQ Pain quality: aching   Pain radiates to:  Does not radiate Pain severity:  Moderate Onset quality:  Gradual Duration:  12 hours Timing:  Constant Progression:  Worsening Chronicity:  New Relieved by:  Nothing Worsened by:  Movement and palpation     Home Medications Prior to Admission medications   Medication Sig Start Date End Date Taking? Authorizing Provider  acetaminophen (TYLENOL) 500 MG tablet Take 500 mg by mouth every 6 (six) hours as needed. Patient not taking: Reported on 09/24/2021    [provider]  aspirin 81 MG chewable tablet Chew 2 tablets (162 mg total) by mouth daily. 09/24/21   Arabella Merles, CNM  Prenatal Vit-Fe Fumarate-FA (PRENATAL VITAMIN PO) Take by mouth.    [provider]      Allergies    Patient has no known allergies.    Review of Systems   Review of Systems  Gastrointestinal:  Positive for abdominal pain.  All other systems reviewed and are negative.  Physical Exam Updated Vital Signs BP 134/80    Pulse 93    Temp 98.1 F (36.7 C) (Oral)    Resp (!) 22    Ht 5\' 7"  (1.702 m)    Wt 123.8 kg    LMP 06/22/2021 (Approximate)    SpO2 100%    BMI 42.76 kg/m  Physical Exam Vitals and nursing note reviewed.  Constitutional:      General: She is not in acute distress.     Appearance: She is well-developed. She is not diaphoretic.  HENT:     Head: Normocephalic and atraumatic.  Cardiovascular:     Rate and Rhythm: Normal rate and regular rhythm.     Heart sounds: No murmur heard.   No friction rub. No gallop.  Pulmonary:     Effort: Pulmonary effort is normal. No respiratory distress.     Breath sounds: Normal breath sounds. No wheezing.  Abdominal:     General: Bowel sounds are normal. There is no distension.     Palpations: Abdomen is soft.     Tenderness: There is abdominal tenderness in the left upper quadrant and left lower quadrant. There is no right CVA tenderness, left CVA tenderness, guarding or rebound.  Musculoskeletal:        General: Normal range of motion.     Cervical back: Normal range of motion and neck supple.  Skin:    General: Skin is warm and dry.  Neurological:     Mental Status: She is alert and oriented to person, place, and time.    ED Results / Procedures / Treatments   Labs (all labs ordered are listed, but only abnormal results are displayed) Labs Reviewed  BASIC METABOLIC PANEL  CBC WITH DIFFERENTIAL/PLATELET  URINALYSIS, ROUTINE W REFLEX MICROSCOPIC  EKG None  Radiology No results found.  Procedures Procedures    Medications Ordered in ED Medications - No data to display  ED Course/ Medical Decision Making/ A&P  This patient presents to the ED for concern of left sided abdominal pain, this involves an extensive number of treatment options, and is a complaint that carries with it a high risk of complications and morbidity.  The differential diagnosis includes urinary tract infection, kidney stone, torsion, diverticulitis, musculoskeletal pain, round ligament pain   Co morbidities that complicate the patient evaluation  Pregnant at [redacted] weeks gestation   Additional history obtained:  External records from outside source obtained and [facility] including [facility] and [facility]  report   Lab Tests:  I Ordered, and personally interpreted labs.  The pertinent results include: CBC showing white count of 10.8, but laboratory test otherwise unremarkable.  Urinalysis is clear.   Imaging Studies ordered:  I performed bedside ultrasound and confirmed fetal heart rate of approximately 140 with good fetal movement.   Cardiac Monitoring:  None   Medicines ordered and prescription drug management:  No medications ordered.  Patient declined Tylenol. I have reviewed the patients home medicines and have made adjustments as needed   Test Considered:  Formal ultrasound considered, however tech is not available at night.  Patient has documented IUP and will have formal ultrasound in the morning   Critical Interventions:  None   Consultations Obtained:  No consultations ordered or obtained   Problem List / ED Course:  Patient presenting with left-sided abdominal pain that started earlier this evening.  She is well-appearing and in no distress.  She is declining any pain medication.  Work-up shows a white count of 10.8, but is otherwise unremarkable.  Bedside ultrasound shows good fetal movement and cardiac activity.  I have considered torsion, however this does not fit the clinical picture.  I feel as though I can discharge this patient and have her return for an ultrasound in the morning.  She is to follow-up with her OB/GYN this afternoon.   Reevaluation:  After the interventions noted above, I reevaluated the patient and found that they have :stayed the same   Social Determinants of Health:  None   Dispostion:  After consideration of the diagnostic results and the patients response to treatment, I feel that the patent would benefit from repeat ultrasound in the morning and follow-up with her OB.    Final Clinical Impression(s) / ED Diagnoses Final diagnoses:  None    Rx / DC Orders ED Discharge Orders     None         Geoffery Lyons,  MD 11/04/21 [phone]

## 2021-11-04 NOTE — Discharge Instructions (Signed)
Return tomorrow at the given time for an ultrasound.  Follow-up with your OB/GYN this afternoon, and return to the ER if symptoms significantly worsen or change.

## 2021-11-04 NOTE — ED Triage Notes (Signed)
Pt c/o left flank pain that she states made her have normal bowel movements.

## 2021-11-04 NOTE — Progress Notes (Signed)
LOW-RISK PREGNANCY VISIT Patient name: Valerie Rasmussen MRN [id number]  Date of birth: 05/11/1991 Chief Complaint:   w/i left sided abdominal pain (Last night constant, but not as back today)  History of Present Illness:   [name] is a 31 y.o. G33P1001 female at [redacted]w[redacted]d with an Estimated Date of Delivery: 03/18/22 being seen today for ongoing management of a low-risk pregnancy.  Depression screen [facility] 2/9 09/24/2021 05/23/2021  Decreased Interest 0 1  Down, Depressed, Hopeless 0 0  PHQ - 2 Score 0 1  Altered sleeping 0 0  Tired, decreased energy 1 1  Change in appetite 0 0  Feeling bad or failure about yourself  0 0  Trouble concentrating 0 0  Moving slowly or fidgety/restless 0 0  Suicidal thoughts 0 0  PHQ-9 Score 1 2    Today she reports resolved left flank and LLQ pain. Pain was colicky, could not sit stillContractions: Not present. Vag. Bleeding: None.  Movement: Present. denies leaking of fluid. Review of Systems:   Pertinent items are noted in HPI Denies abnormal vaginal discharge w/ itching/odor/irritation, headaches, visual changes, shortness of breath, chest pain, abdominal pain, severe nausea/vomiting, or problems with urination or bowel movements unless otherwise stated above. Pertinent History Reviewed:  Reviewed past medical,surgical, social, obstetrical and family history.  Reviewed problem list, medications and allergies. Physical Assessment:   Vitals:   11/04/21 1349  BP: 123/82  Pulse: 97  Weight: 282 lb (127.9 kg)  Body mass index is 44.17 kg/m.        Physical Examination:   General appearance: Well appearing, and in no distress  Mental status: Alert, oriented to person, place, and time  Skin: Warm & dry  Cardiovascular: Normal heart rate noted  Respiratory: Normal respiratory effort, no distress  Abdomen: Soft, gravid, nontender  Pelvic: Cervical exam deferred         Extremities: Edema: None  Fetal Status:     Movement: Present     Chaperone: n/a    Results for orders placed or performed during the hospital encounter of 11/04/21 (from the past 24 hour(s))  Basic metabolic panel   Collection Time: 11/04/21  3:04 AM  Result Value Ref Range   Sodium 132 (L) 135 - 145 mmol/L   Potassium 4.1 3.5 - 5.1 mmol/L   Chloride 104 98 - 111 mmol/L   CO2 21 (L) 22 - 32 mmol/L   Glucose, Bld 87 70 - 99 mg/dL   BUN 11 6 - 20 mg/dL   Creatinine, Ser 2.24 0.44 - 1.00 mg/dL   Calcium 9.1 8.9 - 82.5 mg/dL   GFR, Estimated >00 >37 mL/min   Anion gap 7 5 - 15  CBC with Differential   Collection Time: 11/04/21  3:04 AM  Result Value Ref Range   WBC 10.8 (H) 4.0 - 10.5 K/uL   RBC 3.16 (L) 3.87 - 5.11 MIL/uL   Hemoglobin 10.7 (L) 12.0 - 15.0 g/dL   HCT 04.8 (L) 88.9 - 16.9 %   MCV 96.8 80.0 - 100.0 fL   MCH 33.9 26.0 - 34.0 pg   MCHC 35.0 30.0 - 36.0 g/dL   RDW 45.0 38.8 - 82.8 %   Platelets 190 150 - 400 K/uL   nRBC 0.0 0.0 - 0.2 %   Neutrophils Relative % 72 %   Neutro Abs 7.9 (H) 1.7 - 7.7 K/uL   Lymphocytes Relative 21 %   Lymphs Abs 2.2 0.7 - 4.0 K/uL   Monocytes Relative  5 %   Monocytes Absolute 0.6 0.1 - 1.0 K/uL   Eosinophils Relative 1 %   Eosinophils Absolute 0.1 0.0 - 0.5 K/uL   Basophils Relative 0 %   Basophils Absolute 0.0 0.0 - 0.1 K/uL   Immature Granulocytes 1 %   Abs Immature Granulocytes 0.06 0.00 - 0.07 K/uL  Urinalysis, Routine w reflex microscopic Urine, Clean Catch   Collection Time: 11/04/21  3:25 AM  Result Value Ref Range   Color, Urine YELLOW YELLOW   APPearance HAZY (A) CLEAR   Specific Gravity, Urine 1.021 1.005 - 1.030   pH 5.0 5.0 - 8.0   Glucose, UA NEGATIVE NEGATIVE mg/dL   Hgb urine dipstick NEGATIVE NEGATIVE   Bilirubin Urine NEGATIVE NEGATIVE   Ketones, ur 80 (A) NEGATIVE mg/dL   Protein, ur NEGATIVE NEGATIVE mg/dL   Nitrite NEGATIVE NEGATIVE   Leukocytes,Ua NEGATIVE NEGATIVE    Assessment & Plan:  1) Low-risk pregnancy G2P1001 at [redacted]w[redacted]d with an Estimated Date of Delivery:  03/18/22   2) Suspected left nephrolithiasis, passed or in bladder   Meds: No orders of the defined types were placed in this encounter.  Labs/procedures today: reviewed her labs from ED  Plan:  Continue routine obstetrical care recommend urine straining Next visit: prefers in person    Reviewed: Preterm labor symptoms and general obstetric precautions including but not limited to vaginal bleeding, contractions, leaking of fluid and fetal movement were reviewed in detail with the patient.  All questions were answered. Has home bp cuff. Rx faxed to . Check bp weekly, let us know if >140/90.   Follow-up: No follow-ups on file.  No orders of the defined types were placed in this encounter.   Lazaro Arms, MD 11/04/2021 2:12 PM

## 2021-11-25 ENCOUNTER — Other Ambulatory Visit: Payer: Self-pay

## 2021-11-25 ENCOUNTER — Ambulatory Visit (INDEPENDENT_AMBULATORY_CARE_PROVIDER_SITE_OTHER): Payer: BC Managed Care – PPO | Admitting: Women's Health

## 2021-11-25 ENCOUNTER — Encounter: Payer: Self-pay | Admitting: Women's Health

## 2021-11-25 VITALS — BP 123/83 | HR 91 | Wt 288.0 lb

## 2021-11-25 DIAGNOSIS — Z3482 Encounter for supervision of other normal pregnancy, second trimester: Secondary | ICD-10-CM

## 2021-11-25 DIAGNOSIS — Z3480 Encounter for supervision of other normal pregnancy, unspecified trimester: Secondary | ICD-10-CM

## 2021-11-25 NOTE — Progress Notes (Signed)
? ? ?  LOW-RISK PREGNANCY VISIT ?Patient name: Valerie Rasmussen MRN [id number]  Date of birth: 10-Oct-1990 ?Chief Complaint:   ?Routine Prenatal Visit ? ?History of Present Illness:   ?Valerie Rasmussen is a 31 y.o. G31P1001 female at [redacted]w[redacted]d with an Estimated Date of Delivery: 03/18/22 being seen today for ongoing management of a low-risk pregnancy.  ? ?Today she reports no complaints. Contractions: Not present. Vag. Bleeding: None.  Movement: Present. denies leaking of fluid. ? ?Depression screen [facility] 2/9 09/24/2021 05/23/2021  ?Decreased Interest 0 1  ?Down, Depressed, Hopeless 0 0  ?PHQ - 2 Score 0 1  ?Altered sleeping 0 0  ?Tired, decreased energy 1 1  ?Change in appetite 0 0  ?Feeling bad or failure about yourself  0 0  ?Trouble concentrating 0 0  ?Moving slowly or fidgety/restless 0 0  ?Suicidal thoughts 0 0  ?PHQ-9 Score 1 2  ? ?  ?GAD 7 : Generalized Anxiety Score 09/24/2021 05/23/2021  ?Nervous, Anxious, on Edge 0 0  ?Control/stop worrying 0 0  ?Worry too much - different things 1 0  ?Trouble relaxing 0 0  ?Restless 0 0  ?Easily annoyed or irritable 0 1  ?Afraid - awful might happen 0 0  ?Total GAD 7 Score 1 1  ? ? ?  ?Review of Systems:   ?Pertinent items are noted in HPI ?Denies abnormal vaginal discharge w/ itching/odor/irritation, headaches, visual changes, shortness of breath, chest pain, abdominal pain, severe nausea/vomiting, or problems with urination or bowel movements unless otherwise stated above. ?Pertinent History Reviewed:  ?Reviewed past medical,surgical, social, obstetrical and family history.  ?Reviewed problem list, medications and allergies. ?Physical Assessment:  ? ?Vitals:  ? 11/25/21 1002  ?BP: 123/83  ?Pulse: 91  ?Weight: 288 lb (130.6 kg)  ?Body mass index is 45.11 kg/m?. ?  ?     Physical Examination:  ? General appearance: Well appearing, and in no distress ? Mental status: Alert, oriented to person, place, and time ? Skin: Warm & dry ? Cardiovascular: Normal heart rate noted ? Respiratory:  Normal respiratory effort, no distress ? Abdomen: Soft, gravid, nontender ? Pelvic: Cervical exam deferred        ? Extremities: Edema: None ? ?Fetal Status: Fetal Heart Rate (bpm): 147 Fundal Height: 26 cm Movement: Present   ? ?Chaperone: N/A   ?No results found for this or any previous visit (from the past 24 hour(s)).  ?Assessment & Plan:  ?1) Low-risk pregnancy G2P1001 at [redacted]w[redacted]d with an Estimated Date of Delivery: 03/18/22  ? ?2) H/O GHTN, ASA ?  ?Meds: No orders of the defined types were placed in this encounter. ? ?Labs/procedures today: none ? ?Plan:  Continue routine obstetrical care  ?Next visit: prefers will be in person for pn2    ? ?Reviewed: Preterm labor symptoms and general obstetric precautions including but not limited to vaginal bleeding, contractions, leaking of fluid and fetal movement were reviewed in detail with the patient.  All questions were answered. Does have home bp cuff. Office bp cuff given: not applicable. Check bp weekly, let us know if consistently >140 and/or >90. ? ?Follow-up: Return in about 4 weeks (around 12/23/2021) for LROB, PN2, CNM, in person. ? ?No future appointments. ? ?No orders of the defined types were placed in this encounter. ? ?Cheral Marker CNM, WHNP-BC ?11/25/2021 ?10:18 AM  ?

## 2021-11-25 NOTE — Patient Instructions (Signed)
Valerie Rasmussen, thank you for choosing our office today! We appreciate the opportunity to meet your healthcare needs. You may receive a short survey by mail, e-mail, or through [employer]. If you are happy with your care we would appreciate if you could take just a few minutes to complete the survey questions. We read all of your comments and take your feedback very seriously. Thank you again for choosing our office.  ?Center for [employer] Team at [facility] ? ?[facility] at [facility] ?([address] [place], [place] [address]) ?Entrance C, located off of [address] ?Free 24/7 valet parking  ? ?You will have your sugar test next visit.  Please do not eat or drink anything after midnight the night before you come, not even water.  You will be here for at least two hours.  Please make an appointment online for the bloodwork at [email or web address] for 8:00am (or as close to this as possible). Make sure you select the [facility] service center.  ? ?CLASSES: Go to [email or web address] to register for classes (childbirth, breastfeeding, waterbirth, infant CPR, daddy bootcamp, etc.) ? ?Call the office [phone]) or go to [facility] if: ?You begin to have strong, frequent contractions ?Your water breaks.  Sometimes it is a big gush of fluid, sometimes it is just a trickle that keeps getting your panties wet or running down your legs ?You have vaginal bleeding.  It is normal to have a small amount of spotting if your cervix was checked.  ?You don't feel your baby moving like normal.  If you don't, get you something to eat and drink and lay down and focus on feeling your baby move.   If your baby is still not moving like normal, you should call the office or go to [facility] ? ?Call the office [phone]) or go to [facility] hospital for these signs of pre-eclampsia: ?Severe headache that does not go away with Tylenol ?Visual changes- seeing spots, double, blurred vision ?Pain under your right breast or upper  abdomen that does not go away with Tums or heartburn medicine ?Nausea and/or vomiting ?Severe swelling in your hands, feet, and face  ? ? ?[place] Pediatricians/Family Doctors ?[facility]): [address], [phone]           ?[facility]: [address], [phone]                ?[facility]): [address], [phone] (call to ask if accepting patients) ?[facility] Department: [address], [place], [phone]   ? ?[place] Pediatricians/Family Doctors ?[facility]): [address], [phone] ?[facility]: [address] [place], [phone] ?[facility]: [address], [phone] ? ?[employer] Family Doctors  ?[facility]): [phone] ?[facility]: [address], [phone]  ? ?[facility] Family Doctors ?[facility]: [address] [phone] ? ?[facility]  ?[facility]: [phone], [phone] ? ?Home Blood Pressure Monitoring for Patients  ? ?Your provider has recommended that you check your blood pressure (BP) at least once a week at home. If you do not have a blood pressure cuff at home, one will be provided for you. Contact your provider if you have not received your monitor within 1 week.  ? ?Helpful Tips for Accurate Home Blood Pressure Checks  ?Don't smoke, exercise, or drink  caffeine 30 minutes before checking your BP ?Use the restroom before checking your BP (a full bladder can raise your pressure) ?Relax in a comfortable upright chair ?Feet on the ground ?Left arm resting comfortably on a flat surface at the level of your heart ?Legs uncrossed ?Back supported ?Sit quietly and don't talk ?Place the cuff on your bare arm ?Adjust snuggly, so that only two fingertips can fit between your skin and the top of the cuff ?Check 2  readings separated by at least one minute ?Keep a log of your BP readings ?For a visual, please reference this diagram: http://ccnc.care/bpdiagram ? ?Provider Name: [facility]     Phone: [phone] ? ?Zone 1: ALL CLEAR  ?Continue to monitor your symptoms:  ?BP reading is less than 140 (top number) or less than 90 (bottom number)  ?No right upper stomach pain ?No headaches or seeing spots ?No feeling nauseated or throwing up ?No swelling in face and hands ? ?Zone 2: CAUTION ?Call your doctor's office for any of the following:  ?BP reading is greater than 140 (top number) or greater than 90 (bottom number)  ?Stomach pain under your ribs in the middle or right side ?Headaches or seeing spots ?Feeling nauseated or throwing up ?Swelling in face and hands ? ?Zone 3: EMERGENCY  ?Seek immediate medical care if you have any of the following:  ?BP reading is greater than160 (top number) or greater than 110 (bottom number) ?Severe headaches not improving with Tylenol ?Serious difficulty catching your breath ?Any worsening symptoms from Zone 2  ? ?Second Trimester of Pregnancy ?The second trimester is from week 13 through week 28, months 4 through 6. The second trimester is often a time when you feel your best. Your body has also adjusted to being pregnant, and you begin to feel better physically. Usually, morning sickness has lessened or quit completely, you may have more energy, and you may have an increase in appetite. The second trimester is also a time when the fetus is growing rapidly. At the end of the sixth month, the fetus is about 9 inches long and weighs about 1? pounds. You will likely begin to feel the baby move (quickening) between 18 and 20 weeks of the pregnancy. ?BODY CHANGES ?Your body goes through many changes during pregnancy. The changes vary from woman to woman.  ?Your weight will continue to increase. You will notice your lower abdomen bulging out. ?You may begin to get stretch marks on your  hips, abdomen, and breasts. ?You may develop headaches that can be relieved by medicines approved by your health care provider. ?You may urinate more often because the fetus is pressing on your bladder. ?You may develop or continue to have heartburn as a result of your pregnancy. ?You may develop constipation because certain hormones are causing the muscles that push waste through your intestines to slow down. ?You may develop hemorrhoids or swollen, bulging veins (varicose veins). ?You may have back pain because of the weight gain and pregnancy hormones relaxing your joints between the bones in your pelvis and as a result of a shift in weight and the muscles that support your balance. ?Your breasts will continue to grow and be tender. ?Your gums may bleed and may be sensitive to brushing and flossing. ?Dark spots or blotches (chloasma, mask of pregnancy) may develop on your face. This will likely fade after the baby is born. ?A dark line from your belly button to the pubic area (linea nigra) may appear. This  will likely fade after the baby is born. ?You may have changes in your hair. These can include thickening of your hair, rapid growth, and changes in texture. Some women also have hair loss during or after pregnancy, or hair that feels dry or thin. Your hair will most likely return to normal after your baby is born. ?WHAT TO EXPECT AT YOUR PRENATAL VISITS ?During a routine prenatal visit: ?You will be weighed to make sure you and the fetus are growing normally. ?Your blood pressure will be taken. ?Your abdomen will be measured to track your baby's growth. ?The fetal heartbeat will be listened to. ?Any test results from the previous visit will be discussed. ?Your health care provider may ask you: ?How you are feeling. ?If you are feeling the baby move. ?If you have had any abnormal symptoms, such as leaking fluid, bleeding, severe headaches, or abdominal cramping. ?If you have any questions. ?Other tests that may  be performed during your second trimester include: ?Blood tests that check for: ?Low iron levels (anemia). ?Gestational diabetes (between 24 and 28 weeks). ?Rh antibodies. ?Urine tests to check for infections,

## 2021-12-23 ENCOUNTER — Other Ambulatory Visit: Payer: BC Managed Care – PPO

## 2021-12-23 ENCOUNTER — Encounter: Payer: Self-pay | Admitting: Women's Health

## 2021-12-23 ENCOUNTER — Ambulatory Visit (INDEPENDENT_AMBULATORY_CARE_PROVIDER_SITE_OTHER): Payer: BC Managed Care – PPO | Admitting: Women's Health

## 2021-12-23 VITALS — BP 131/79 | HR 84 | Wt 296.0 lb

## 2021-12-23 DIAGNOSIS — Z3480 Encounter for supervision of other normal pregnancy, unspecified trimester: Secondary | ICD-10-CM

## 2021-12-23 DIAGNOSIS — Z23 Encounter for immunization: Secondary | ICD-10-CM

## 2021-12-23 DIAGNOSIS — Z3483 Encounter for supervision of other normal pregnancy, third trimester: Secondary | ICD-10-CM

## 2021-12-23 DIAGNOSIS — Z3A27 27 weeks gestation of pregnancy: Secondary | ICD-10-CM

## 2021-12-23 DIAGNOSIS — Z3482 Encounter for supervision of other normal pregnancy, second trimester: Secondary | ICD-10-CM | POA: Diagnosis not present

## 2021-12-23 DIAGNOSIS — Z131 Encounter for screening for diabetes mellitus: Secondary | ICD-10-CM | POA: Diagnosis not present

## 2021-12-23 NOTE — Progress Notes (Signed)
? ? ?LOW-RISK PREGNANCY VISIT ?Patient name: Valerie Rasmussen MRN [id number]  Date of birth: [birth year] ?Chief Complaint:   ?Routine Prenatal Visit ? ?History of Present Illness:   ?Valerie Rasmussen is a 31 y.o. G17P1001 female at [redacted]w[redacted]d with an Estimated Date of Delivery: 03/18/22 being seen today for ongoing management of a low-risk pregnancy.  ? ?Today she reports no complaints. Contractions: Not present. Vag. Bleeding: None.  Movement: Present. denies leaking of fluid. ? ? ?  12/23/2021  ?  9:13 AM 09/24/2021  ? 10:35 AM 05/23/2021  ? 10:50 AM  ?Depression screen PHQ 2/9  ?Decreased Interest 1 0 1  ?Down, Depressed, Hopeless 0 0 0  ?PHQ - 2 Score 1 0 1  ?Altered sleeping 0 0 0  ?Tired, decreased energy 1 1 1   ?Change in appetite 0 0 0  ?Feeling bad or failure about yourself  0 0 0  ?Trouble concentrating 0 0 0  ?Moving slowly or fidgety/restless 0 0 0  ?Suicidal thoughts 0 0 0  ?PHQ-9 Score 2 1 2   ? ?  ? ?  12/23/2021  ?  9:13 AM 09/24/2021  ? 10:36 AM 05/23/2021  ? 10:50 AM  ?GAD 7 : Generalized Anxiety Score  ?Nervous, Anxious, on Edge 0 0 0  ?Control/stop worrying 0 0 0  ?Worry too much - different things 0 1 0  ?Trouble relaxing 0 0 0  ?Restless 0 0 0  ?Easily annoyed or irritable 0 0 1  ?Afraid - awful might happen 0 0 0  ?Total GAD 7 Score 0 1 1  ? ? ?  ?Review of Systems:   ?Pertinent items are noted in HPI ?Denies abnormal vaginal discharge w/ itching/odor/irritation, headaches, visual changes, shortness of breath, chest pain, abdominal pain, severe nausea/vomiting, or problems with urination or bowel movements unless otherwise stated above. ?Pertinent History Reviewed:  ?Reviewed past medical,surgical, social, obstetrical and family history.  ?Reviewed problem list, medications and allergies. ?Physical Assessment:  ? ?Vitals:  ? 12/23/21 0914  ?BP: 131/79  ?Pulse: 84  ?Weight: 296 lb (134.3 kg)  ?Body mass index is 46.36 kg/m?. ?  ?     Physical Examination:  ? General appearance: Well appearing, and in no  distress ? Mental status: Alert, oriented to person, place, and time ? Skin: Warm & dry ? Cardiovascular: Normal heart rate noted ? Respiratory: Normal respiratory effort, no distress ? Abdomen: Soft, gravid, nontender ? Pelvic: Cervical exam deferred        ? Extremities: Edema: None ? ?Fetal Status: Fetal Heart Rate (bpm): 140 Fundal Height: 29 cm Movement: Present   ? ?Chaperone: N/A   ?No results found for this or any previous visit (from the past 24 hour(s)).  ?Assessment & Plan:  ?1) Low-risk pregnancy G2P1001 at [redacted]w[redacted]d with an Estimated Date of Delivery: 03/18/22  ? ?2) H/O GHTN, ASA 162mg , continue checking home bp's, if >140/90 or sx, let [redacted]w[redacted]d know/go to [facility] ?  ?Meds: No orders of the defined types were placed in this encounter. ? ?Labs/procedures today: tdap and PN2 ? ?Plan:  Continue routine obstetrical care  ?Next visit: prefers in person   ? ?Reviewed: Preterm labor symptoms and general obstetric precautions including but not limited to vaginal bleeding, contractions, leaking of fluid and fetal movement were reviewed in detail with the patient.  All questions were answered. Does have home bp cuff. Office bp cuff given: not applicable. Check bp weekly, let know if consistently >140 and/or >90. ? ?Follow-up:  Return in about 4 weeks (around 01/20/2022) for LROB, CNM, in person. ? ?Future Appointments  ?Date Time Provider Department Center  ?12/23/2021  9:30 AM Cheral Marker, CNM [facility]  ? ? ?No orders of the defined types were placed in this encounter. ? ?Cheral Marker CNM, WHNP-BC ?12/23/2021 ?9:26 AM  ?

## 2021-12-23 NOTE — Patient Instructions (Signed)
Valerie Rasmussen, thank you for choosing our office today! We appreciate the opportunity to meet your healthcare needs. You may receive a short survey by mail, e-mail, or through [employer]. If you are happy with your care we would appreciate if you could take just a few minutes to complete the survey questions. We read all of your comments and take your feedback very seriously. Thank you again for choosing our office.  ?Center for [employer] Team at [facility] ? ?[facility] ?([address] [place], [place] [address]) ?Entrance C, located off of [address] ?Free 24/7 valet parking  ? ?CLASSES: Go to [email or web address] to register for classes (childbirth, breastfeeding, waterbirth, infant CPR, daddy bootcamp, etc.) ? ?Call the office [phone]) or go to [facility] if: ?You begin to have strong, frequent contractions ?Your water breaks.  Sometimes it is a big gush of fluid, sometimes it is just a trickle that keeps getting your panties wet or running down your legs ?You have vaginal bleeding.  It is normal to have a small amount of spotting if your cervix was checked.  ?You don't feel your baby moving like normal.  If you don't, get you something to eat and drink and lay down and focus on feeling your baby move.   If your baby is still not moving like normal, you should call the office or go to [facility]. ? ?Call the office [phone]) or go to [facility] hospital for these signs of pre-eclampsia: ?Severe headache that does not go away with Tylenol ?Visual changes- seeing spots, double, blurred vision ?Pain under your right breast or upper abdomen that does not go away with Tums or heartburn medicine ?Nausea and/or vomiting ?Severe swelling in your hands, feet, and face  ? ?Tdap Vaccine ?It is recommended that you get the Tdap vaccine during the third trimester of EACH pregnancy to help protect your baby from getting pertussis (whooping cough) ?27-36 weeks is the BEST time to do  this so that you can pass the protection on to your baby. During pregnancy is better than after pregnancy, but if you are unable to get it during pregnancy it will be offered at the hospital.  ?You can get this vaccine with [place], at the health department, your family doctor, or some local pharmacies ?Everyone who will be around your baby should also be up-to-date on their vaccines before the baby comes. Adults (who are not pregnant) only need 1 dose of Tdap during adulthood.  ? ?[place] Pediatricians/Family Doctors ?[facility]): [address], [phone]           ?[facility]: [address], [phone]                ?[facility]): [address], [phone] (call to ask if accepting patients) ?[facility] Department: [address], [place], [phone]   ? ?[place] Pediatricians/Family Doctors ?[facility]): [address], [phone] ?[facility]: [address] [place], [phone] ?[facility]: [address], [phone] ? ?[employer] Family Doctors  ?[facility]): [phone] ?[facility]: [address], [phone]  ? ?[facility] Family Doctors ?[facility]: [address] [phone] ? ?[facility]  ?[facility]: [phone], [phone] ? ?Home Blood Pressure Monitoring for Patients  ? ?Your provider has recommended that you check your  blood pressure (BP) at least once a week at home. If you do not have a blood pressure cuff at home, one will be provided for you. Contact your provider if you have not received your monitor within 1 week.  ? ?Helpful Tips for Accurate Home Blood Pressure Checks  ?Don't smoke, exercise, or drink caffeine 30 minutes before checking your BP ?Use the restroom before checking your BP (a full bladder can raise your  pressure) ?Relax in a comfortable upright chair ?Feet on the ground ?Left arm resting comfortably on a flat surface at the level of your heart ?Legs uncrossed ?Back supported ?Sit quietly and don't talk ?Place the cuff on your bare arm ?Adjust snuggly, so that only two fingertips can fit between your skin and the top of the cuff ?Check 2 readings separated by at least one minute ?Keep a log of your BP readings ?For a visual, please reference this diagram: http://ccnc.care/bpdiagram ? ?Provider Name: [facility]     Phone: [phone] ? ?Zone 1: ALL CLEAR  ?Continue to monitor your symptoms:  ?BP reading is less than 140 (top number) or less than 90 (bottom number)  ?No right upper stomach pain ?No headaches or seeing spots ?No feeling nauseated or throwing up ?No swelling in face and hands ? ?Zone 2: CAUTION ?Call your doctor's office for any of the following:  ?BP reading is greater than 140 (top number) or greater than 90 (bottom number)  ?Stomach pain under your ribs in the middle or right side ?Headaches or seeing spots ?Feeling nauseated or throwing up ?Swelling in face and hands ? ?Zone 3: EMERGENCY  ?Seek immediate medical care if you have any of the following:  ?BP reading is greater than160 (top number) or greater than 110 (bottom number) ?Severe headaches not improving with Tylenol ?Serious difficulty catching your breath ?Any worsening symptoms from Zone 2  ? ?Third Trimester of Pregnancy ?The third trimester is from week 29 through week 42, months 7 through 9. The third trimester is a time when the fetus is growing rapidly. At the end of the ninth month, the fetus is about 20 inches in length and weighs 6-10 pounds.  ?BODY CHANGES ?Your body goes through many changes during pregnancy. The changes vary from woman to woman.  ?Your weight will continue to increase. You can expect to gain 25-35 pounds (11-16 kg) by the end of the pregnancy. ?You may begin to get stretch marks on your hips, abdomen,  and breasts. ?You may urinate more often because the fetus is moving lower into your pelvis and pressing on your bladder. ?You may develop or continue to have heartburn as a result of your pregnancy. ?You may develop constipation because certain hormones are causing the muscles that push waste through your intestines to slow down. ?You may develop hemorrhoids or swollen, bulging veins (varicose veins). ?You may have pelvic pain because of the weight gain and pregnancy hormones relaxing your joints between the bones in your pelvis. Backaches may result from overexertion of the muscles supporting your posture. ?You may have changes in your hair. These can include thickening of your hair, rapid growth, and changes in texture. Some women also have hair loss during or after pregnancy, or hair that feels dry or thin. Your hair will most likely return to normal after your baby is born. ?Your breasts will continue to grow and be tender. A yellow discharge may leak from your breasts called colostrum. ?Your belly button may stick out. ?You may  feel short of breath because of your expanding uterus. ?You may notice the fetus "dropping," or moving lower in your abdomen. ?You may have a bloody mucus discharge. This usually occurs a few days to a week before labor begins. ?Your cervix becomes thin and soft (effaced) near your due date. ?WHAT TO EXPECT AT [place]  ?You will have prenatal exams every 2 weeks until week 36. Then, you will have weekly prenatal exams. During a routine prenatal visit: ?You will be weighed to make sure you and the fetus are growing normally. ?Your blood pressure is taken. ?Your abdomen will be measured to track your baby's growth. ?The fetal heartbeat will be listened to. ?Any test results from the previous visit will be discussed. ?You may have a cervical check near your due date to see if you have effaced. ?At around 36 weeks, your caregiver will check your cervix. At the same time, your  caregiver will also perform a test on the secretions of the vaginal tissue. This test is to determine if a type of bacteria, Group B streptococcus, is present. Your caregiver will explain this further. ?Your c

## 2021-12-23 NOTE — Addendum Note (Signed)
Addended by: Annamarie Dawley on: 12/23/2021 09:28 AM ? ? Modules accepted: Orders ? ?

## 2021-12-24 LAB — CBC
Hematocrit: 31.2 % — ABNORMAL LOW (ref 34.0–46.6)
Hemoglobin: 10.5 g/dL — ABNORMAL LOW (ref 11.1–15.9)
MCH: 32.4 pg (ref 26.6–33.0)
MCHC: 33.7 g/dL (ref 31.5–35.7)
MCV: 96 fL (ref 79–97)
Platelets: 167 10*3/uL (ref 150–450)
RBC: 3.24 x10E6/uL — ABNORMAL LOW (ref 3.77–5.28)
RDW: 12.6 % (ref 11.7–15.4)
WBC: 9.1 10*3/uL (ref 3.4–10.8)

## 2021-12-24 LAB — GLUCOSE TOLERANCE, 2 HOURS W/ 1HR
Glucose, 1 hour: 96 mg/dL (ref 70–179)
Glucose, 2 hour: 109 mg/dL (ref 70–152)
Glucose, Fasting: 82 mg/dL (ref 70–91)

## 2021-12-24 LAB — RPR: RPR Ser Ql: NONREACTIVE

## 2021-12-24 LAB — HIV ANTIBODY (ROUTINE TESTING W REFLEX): HIV Screen 4th Generation wRfx: NONREACTIVE

## 2021-12-24 LAB — ANTIBODY SCREEN: Antibody Screen: NEGATIVE

## 2022-01-20 ENCOUNTER — Ambulatory Visit (INDEPENDENT_AMBULATORY_CARE_PROVIDER_SITE_OTHER): Payer: BC Managed Care – PPO | Admitting: Women's Health

## 2022-01-20 ENCOUNTER — Encounter: Payer: Self-pay | Admitting: Women's Health

## 2022-01-20 VITALS — BP 133/84 | HR 105 | Wt 295.0 lb

## 2022-01-20 DIAGNOSIS — Z3483 Encounter for supervision of other normal pregnancy, third trimester: Secondary | ICD-10-CM

## 2022-01-20 NOTE — Progress Notes (Signed)
? ? ?LOW-RISK PREGNANCY VISIT ?Patient name: Valerie Rasmussen MRN [id number]  Date of birth: 1991-06-18 ?Chief Complaint:   ?Routine Prenatal Visit ? ?History of Present Illness:   ?Valerie Rasmussen is a 31 y.o. G50P1001 female at [redacted]w[redacted]d with an Estimated Date of Delivery: 03/18/22 being seen today for ongoing management of a low-risk pregnancy.  ? ?Today she reports no complaints. Checks home bp's 2x/wk, all wnl.  Contractions: Not present. Vag. Bleeding: None.  Movement: Present. denies leaking of fluid. ? ? ?  12/23/2021  ?  9:13 AM 09/24/2021  ? 10:35 AM 05/23/2021  ? 10:50 AM  ?Depression screen PHQ 2/9  ?Decreased Interest 1 0 1  ?Down, Depressed, Hopeless 0 0 0  ?PHQ - 2 Score 1 0 1  ?Altered sleeping 0 0 0  ?Tired, decreased energy 1 1 1   ?Change in appetite 0 0 0  ?Feeling bad or failure about yourself  0 0 0  ?Trouble concentrating 0 0 0  ?Moving slowly or fidgety/restless 0 0 0  ?Suicidal thoughts 0 0 0  ?PHQ-9 Score 2 1 2   ? ?  ? ?  12/23/2021  ?  9:13 AM 09/24/2021  ? 10:36 AM 05/23/2021  ? 10:50 AM  ?GAD 7 : Generalized Anxiety Score  ?Nervous, Anxious, on Edge 0 0 0  ?Control/stop worrying 0 0 0  ?Worry too much - different things 0 1 0  ?Trouble relaxing 0 0 0  ?Restless 0 0 0  ?Easily annoyed or irritable 0 0 1  ?Afraid - awful might happen 0 0 0  ?Total GAD 7 Score 0 1 1  ? ? ?  ?Review of Systems:   ?Pertinent items are noted in HPI ?Denies abnormal vaginal discharge w/ itching/odor/irritation, headaches, visual changes, shortness of breath, chest pain, abdominal pain, severe nausea/vomiting, or problems with urination or bowel movements unless otherwise stated above. ?Pertinent History Reviewed:  ?Reviewed past medical,surgical, social, obstetrical and family history.  ?Reviewed problem list, medications and allergies. ?Physical Assessment:  ? ?Vitals:  ? 01/20/22 0834  ?BP: 133/84  ?Pulse: (!) 105  ?Weight: 295 lb (133.8 kg)  ?Body mass index is 46.2 kg/m?. ?  ?     Physical Examination:  ? General  appearance: Well appearing, and in no distress ? Mental status: Alert, oriented to person, place, and time ? Skin: Warm & dry ? Cardiovascular: Normal heart rate noted ? Respiratory: Normal respiratory effort, no distress ? Abdomen: Soft, gravid, nontender ? Pelvic: Cervical exam deferred        ? Extremities: Edema: None ? ?Fetal Status: Fetal Heart Rate (bpm): 133 Fundal Height: 30 cm Movement: Present   ? ?Chaperone: N/A   ?No results found for this or any previous visit (from the past 24 hour(s)).  ?Assessment & Plan:  ?1) Low-risk pregnancy G2P1001 at [redacted]w[redacted]d with an Estimated Date of Delivery: 03/18/22  ? ?2) H/O GHTN, check bp's 2x/wk, reviewed s/s, reasons to seek care ?  ?Meds: No orders of the defined types were placed in this encounter. ? ?Labs/procedures today: none ? ?Plan:  Continue routine obstetrical care  ?Next visit: prefers in person   ? ?Reviewed: Preterm labor symptoms and general obstetric precautions including but not limited to vaginal bleeding, contractions, leaking of fluid and fetal movement were reviewed in detail with the patient.  All questions were answered. Does have home bp cuff. Office bp cuff given: not applicable. Check bp 2x/wk let [redacted]w[redacted]d know if consistently >140 and/or >90. ? ?Follow-up:  Return in about 2 weeks (around 02/03/2022) for LROB, CNM, in person. ? ?No future appointments. ? ?No orders of the defined types were placed in this encounter. ? ?Cheral Marker CNM, WHNP-BC ?01/20/2022 ?8:52 AM  ?

## 2022-01-20 NOTE — Patient Instructions (Signed)
Valerie Rasmussen, thank you for choosing our office today! We appreciate the opportunity to meet your healthcare needs. You may receive a short survey by mail, e-mail, or through [employer]. If you are happy with your care we would appreciate if you could take just a few minutes to complete the survey questions. We read all of your comments and take your feedback very seriously. Thank you again for choosing our office.  ?Center for [employer] Team at [facility] ? ?[facility] at [facility] ?([address] [place], [place] [address]) ?Entrance C, located off of [address] ?Free 24/7 valet parking  ? ?CLASSES: Go to [email or web address] to register for classes (childbirth, breastfeeding, waterbirth, infant CPR, daddy bootcamp, etc.) ? ?Call the office [phone]) or go to [facility] if: ?You begin to have strong, frequent contractions ?Your water breaks.  Sometimes it is a big gush of fluid, sometimes it is just a trickle that keeps getting your panties wet or running down your legs ?You have vaginal bleeding.  It is normal to have a small amount of spotting if your cervix was checked.  ?You don't feel your baby moving like normal.  If you don't, get you something to eat and drink and lay down and focus on feeling your baby move.   If your baby is still not moving like normal, you should call the office or go to [facility]. ? ?Call the office [phone]) or go to [facility] hospital for these signs of pre-eclampsia: ?Severe headache that does not go away with Tylenol ?Visual changes- seeing spots, double, blurred vision ?Pain under your right breast or upper abdomen that does not go away with Tums or heartburn medicine ?Nausea and/or vomiting ?Severe swelling in your hands, feet, and face  ? ?Tdap Vaccine ?It is recommended that you get the Tdap vaccine during the third trimester of EACH pregnancy to help protect your baby from getting pertussis (whooping cough) ?27-36 weeks is the BEST time to do  this so that you can pass the protection on to your baby. During pregnancy is better than after pregnancy, but if you are unable to get it during pregnancy it will be offered at the hospital.  ?You can get this vaccine with [place], at the health department, your family doctor, or some local pharmacies ?Everyone who will be around your baby should also be up-to-date on their vaccines before the baby comes. Adults (who are not pregnant) only need 1 dose of Tdap during adulthood.  ? ?[place] Pediatricians/Family Doctors ?[facility]): [address], [phone]           ?[facility]: [address], [phone]                ?[facility]): [address], [phone] (call to ask if accepting patients) ?[facility] Department: [address], [place], [phone]   ? ?[place] Pediatricians/Family Doctors ?[facility]): [address], [phone] ?[facility]: [address] [place], [phone] ?[facility]: [address], [phone] ? ?[employer] Family Doctors  ?[facility]): [phone] ?[facility]: [address], [phone]  ? ?[facility] Family Doctors ?[facility]: [address] [phone] ? ?[facility]  ?[facility]: [phone], [phone] ? ?Home Blood Pressure Monitoring for Patients  ? ?Your provider has recommended that you check your  blood pressure (BP) at least once a week at home. If you do not have a blood pressure cuff at home, one will be provided for you. Contact your provider if you have not received your monitor within 1 week.  ? ?Helpful Tips for Accurate Home Blood Pressure Checks  ?Don't smoke, exercise, or drink caffeine 30 minutes before checking your BP ?Use the restroom before checking your BP (a full bladder can raise your  pressure) ?Relax in a comfortable upright chair ?Feet on the ground ?Left arm resting comfortably on a flat surface at the level of your heart ?Legs uncrossed ?Back supported ?Sit quietly and don't talk ?Place the cuff on your bare arm ?Adjust snuggly, so that only two fingertips can fit between your skin and the top of the cuff ?Check 2 readings separated by at least one minute ?Keep a log of your BP readings ?For a visual, please reference this diagram: http://ccnc.care/bpdiagram ? ?Provider Name: [facility]     Phone: [phone] ? ?Zone 1: ALL CLEAR  ?Continue to monitor your symptoms:  ?BP reading is less than 140 (top number) or less than 90 (bottom number)  ?No right upper stomach pain ?No headaches or seeing spots ?No feeling nauseated or throwing up ?No swelling in face and hands ? ?Zone 2: CAUTION ?Call your doctor's office for any of the following:  ?BP reading is greater than 140 (top number) or greater than 90 (bottom number)  ?Stomach pain under your ribs in the middle or right side ?Headaches or seeing spots ?Feeling nauseated or throwing up ?Swelling in face and hands ? ?Zone 3: EMERGENCY  ?Seek immediate medical care if you have any of the following:  ?BP reading is greater than160 (top number) or greater than 110 (bottom number) ?Severe headaches not improving with Tylenol ?Serious difficulty catching your breath ?Any worsening symptoms from Zone 2  ?Preterm Labor and Birth Information ? ?The normal length of a pregnancy is 39-41 weeks. Preterm labor is when labor starts before 37 completed weeks of pregnancy. ?What are the risk factors for preterm labor? ?Preterm labor is more likely to occur in women who: ?Have certain infections during pregnancy such as a bladder infection, sexually transmitted infection, or infection inside the uterus (chorioamnionitis). ?Have a shorter-than-normal cervix. ?Have gone into preterm labor before. ?Have had surgery on their cervix. ?Are younger than age 72  or older than age 108. ?Are African American. ?Are pregnant with twins or multiple babies (multiple gestation). ?Take street drugs or smoke while pregnant. ?Do not gain enough weight while pregnant. ?Became pregnant shortly after having been pregnant. ?What are the symptoms of preterm labor? ?Symptoms of preterm labor include: ?Cramps similar to those that can happen during a menstrual period. The cramps may happen with diarrhea. ?Pain in the abdomen or lower back. ?Regular uterine contractions that may feel like tightening of the abdomen. ?A feeling of increased pressure in the pelvis. ?Increased watery or bloody mucus discharge from the vagina. ?Water breaking (ruptured amniotic sac). ?Why is it important to recognize signs of preterm labor? ?It is important to recognize signs of preterm labor because babies who are born prematurely may not be fully developed. This can put them at an increased risk for: ?Long-term (chronic) heart and lung problems. ?Difficulty immediately after birth with regulating body systems, including blood sugar, body temperature, heart rate, and breathing rate. ?Bleeding in the brain. ?Cerebral palsy. ?Learning difficulties. ?Death. ?These risks are highest for babies who are born before 11 weeks  of pregnancy. ?How is preterm labor treated? ?Treatment depends on the length of your pregnancy, your condition, and the health of your baby. It may involve: ?Having a stitch (suture) placed in your cervix to prevent your cervix from opening too early (cerclage). ?Taking or being given medicines, such as: ?Hormone medicines. These may be given early in pregnancy to help support the pregnancy. ?Medicine to stop contractions. ?Medicines to help mature the baby?s lungs. These may be prescribed if the risk of delivery is high. ?Medicines to prevent your baby from developing cerebral palsy. ?If the labor happens before 34 weeks of pregnancy, you may need to stay in the hospital. ?What should I do if I  think I am in preterm labor? ?If you think that you are going into preterm labor, call your health care provider right away. ?How can I prevent preterm labor in future pregnancies? ?To increase your chance o

## 2022-02-04 ENCOUNTER — Ambulatory Visit (INDEPENDENT_AMBULATORY_CARE_PROVIDER_SITE_OTHER): Payer: BC Managed Care – PPO | Admitting: Advanced Practice Midwife

## 2022-02-04 ENCOUNTER — Encounter: Payer: Self-pay | Admitting: Advanced Practice Midwife

## 2022-02-04 VITALS — BP 126/83 | HR 97 | Wt 300.0 lb

## 2022-02-04 DIAGNOSIS — Z3A34 34 weeks gestation of pregnancy: Secondary | ICD-10-CM

## 2022-02-04 DIAGNOSIS — Z029 Encounter for administrative examinations, unspecified: Secondary | ICD-10-CM

## 2022-02-04 DIAGNOSIS — Z3480 Encounter for supervision of other normal pregnancy, unspecified trimester: Secondary | ICD-10-CM

## 2022-02-04 NOTE — Progress Notes (Signed)
   LOW-RISK PREGNANCY VISIT Patient name: Valerie Rasmussen MRN [id number]  Date of birth: [birth year] Chief Complaint:   Routine Prenatal Visit  History of Present Illness:   Valerie Rasmussen is a 31 y.o. G44P1001 female at [redacted]w[redacted]d with an Estimated Date of Delivery: 03/18/22 being seen today for ongoing management of a low-risk pregnancy.  Today she reports  some carpal tunnel symptoms that are helped some with wrist supports; some bilat feet swelling; LBP with working . Contractions: Not present. Vag. Bleeding: None.  Movement: Present. denies leaking of fluid. Review of Systems:   Pertinent items are noted in HPI Denies abnormal vaginal discharge w/ itching/odor/irritation, headaches, visual changes, shortness of breath, chest pain, abdominal pain, severe nausea/vomiting, or problems with urination or bowel movements unless otherwise stated above. Pertinent History Reviewed:  Reviewed past medical,surgical, social, obstetrical and family history.  Reviewed problem list, medications and allergies. Physical Assessment:   Vitals:   02/04/22 0856  BP: 126/83  Pulse: 97  Weight: 300 lb (136.1 kg)  Body mass index is 46.99 kg/m.        Physical Examination:   General appearance: Well appearing, and in no distress  Mental status: Alert, oriented to person, place, and time  Skin: Warm & dry  Cardiovascular: Normal heart rate noted  Respiratory: Normal respiratory effort, no distress  Abdomen: Soft, gravid, nontender  Pelvic: Cervical exam deferred         Extremities: Edema: Trace  Fetal Status: Fetal Heart Rate (bpm): 135 Fundal Height: 34 cm Movement: Present    No results found for this or any previous visit (from the past 24 hour(s)).  Assessment & Plan:  1) Low-risk pregnancy G2P1001 at [redacted]w[redacted]d with an Estimated Date of Delivery: 03/18/22   2) Discomforts of late preg, she is using comfort measures for these and coping well  3) Hx gHTN, BPs nl so far; checking 2x/week at home;  warning signs rev'd   Meds: No orders of the defined types were placed in this encounter.  Labs/procedures today: none  Plan:  Continue routine obstetrical care   Reviewed: Preterm labor symptoms and general obstetric precautions including but not limited to vaginal bleeding, contractions, leaking of fluid and fetal movement were reviewed in detail with the patient.  All questions were answered. Has home bp cuff.  Check bp at least twice weekly, let us know if >140/90.   Follow-up: Return in about 2 weeks (around 02/18/2022) for (not before 6/14) LROB, in person; GBS/cultures.  No orders of the defined types were placed in this encounter.  Arabella Merles CNM 02/04/2022 9:22 AM

## 2022-02-09 ENCOUNTER — Encounter: Payer: Self-pay | Admitting: *Deleted

## 2022-02-11 ENCOUNTER — Telehealth: Payer: Self-pay | Admitting: Obstetrics & Gynecology

## 2022-02-18 ENCOUNTER — Encounter (HOSPITAL_COMMUNITY): Payer: Self-pay | Admitting: Obstetrics and Gynecology

## 2022-02-18 ENCOUNTER — Encounter: Payer: Self-pay | Admitting: Obstetrics & Gynecology

## 2022-02-18 ENCOUNTER — Inpatient Hospital Stay (HOSPITAL_COMMUNITY)
Admission: AD | Admit: 2022-02-18 | Discharge: 2022-02-18 | Disposition: A | Discharge status: Home / Self Care | Payer: BC Managed Care – PPO | Attending: Obstetrics and Gynecology | Admitting: Obstetrics and Gynecology

## 2022-02-18 ENCOUNTER — Other Ambulatory Visit (HOSPITAL_COMMUNITY)
Admission: RE | Admit: 2022-02-18 | Discharge: 2022-02-18 | Disposition: A | Discharge status: Home / Self Care | Payer: BC Managed Care – PPO | Source: Ambulatory Visit | Attending: Obstetrics & Gynecology | Admitting: Obstetrics & Gynecology

## 2022-02-18 ENCOUNTER — Ambulatory Visit (INDEPENDENT_AMBULATORY_CARE_PROVIDER_SITE_OTHER): Payer: BC Managed Care – PPO | Admitting: Obstetrics & Gynecology

## 2022-02-18 VITALS — BP 142/87 | HR 99 | Wt 302.0 lb

## 2022-02-18 DIAGNOSIS — Z3A36 36 weeks gestation of pregnancy: Secondary | ICD-10-CM

## 2022-02-18 DIAGNOSIS — O139 Gestational [pregnancy-induced] hypertension without significant proteinuria, unspecified trimester: Secondary | ICD-10-CM | POA: Diagnosis present

## 2022-02-18 DIAGNOSIS — Z3483 Encounter for supervision of other normal pregnancy, third trimester: Secondary | ICD-10-CM | POA: Insufficient documentation

## 2022-02-18 DIAGNOSIS — Z7982 Long term (current) use of aspirin: Secondary | ICD-10-CM | POA: Diagnosis not present

## 2022-02-18 DIAGNOSIS — O99210 Obesity complicating pregnancy, unspecified trimester: Secondary | ICD-10-CM

## 2022-02-18 DIAGNOSIS — O133 Gestational [pregnancy-induced] hypertension without significant proteinuria, third trimester: Secondary | ICD-10-CM

## 2022-02-18 DIAGNOSIS — R030 Elevated blood-pressure reading, without diagnosis of hypertension: Secondary | ICD-10-CM | POA: Diagnosis not present

## 2022-02-18 DIAGNOSIS — Z7722 Contact with and (suspected) exposure to environmental tobacco smoke (acute) (chronic): Secondary | ICD-10-CM | POA: Diagnosis not present

## 2022-02-18 DIAGNOSIS — Z3480 Encounter for supervision of other normal pregnancy, unspecified trimester: Secondary | ICD-10-CM

## 2022-02-18 LAB — CBC
HCT: 30.9 % — ABNORMAL LOW (ref 36.0–46.0)
Hemoglobin: 11 g/dL — ABNORMAL LOW (ref 12.0–15.0)
MCH: 32.8 pg (ref 26.0–34.0)
MCHC: 35.6 g/dL (ref 30.0–36.0)
MCV: 92.2 fL (ref 80.0–100.0)
Platelets: 181 10*3/uL (ref 150–400)
RBC: 3.35 MIL/uL — ABNORMAL LOW (ref 3.87–5.11)
RDW: 13.7 % (ref 11.5–15.5)
WBC: 10 10*3/uL (ref 4.0–10.5)
nRBC: 0 % (ref 0.0–0.2)

## 2022-02-18 LAB — URINALYSIS, ROUTINE W REFLEX MICROSCOPIC
Bilirubin Urine: NEGATIVE
Glucose, UA: NEGATIVE mg/dL
Hgb urine dipstick: NEGATIVE
Ketones, ur: NEGATIVE mg/dL
Leukocytes,Ua: NEGATIVE
Nitrite: NEGATIVE
Protein, ur: NEGATIVE mg/dL
Specific Gravity, Urine: 1.019 (ref 1.005–1.030)
pH: 5 (ref 5.0–8.0)

## 2022-02-18 LAB — PROTEIN / CREATININE RATIO, URINE
Creatinine, Urine: 158.3 mg/dL
Protein Creatinine Ratio: 0.21 mg/mg{Cre} — ABNORMAL HIGH (ref 0.00–0.15)
Total Protein, Urine: 33 mg/dL

## 2022-02-18 LAB — COMPREHENSIVE METABOLIC PANEL
ALT: 45 U/L — ABNORMAL HIGH (ref 0–44)
AST: 27 U/L (ref 15–41)
Albumin: 2.9 g/dL — ABNORMAL LOW (ref 3.5–5.0)
Alkaline Phosphatase: 128 U/L — ABNORMAL HIGH (ref 38–126)
Anion gap: 12 (ref 5–15)
BUN: 7 mg/dL (ref 6–20)
CO2: 18 mmol/L — ABNORMAL LOW (ref 22–32)
Calcium: 9.7 mg/dL (ref 8.9–10.3)
Chloride: 107 mmol/L (ref 98–111)
Creatinine, Ser: 0.57 mg/dL (ref 0.44–1.00)
GFR, Estimated: >60 mL/min (ref >60)
Glucose, Bld: 83 mg/dL (ref 70–99)
Potassium: 3.8 mmol/L (ref 3.5–5.1)
Sodium: 137 mmol/L (ref 135–145)
Total Bilirubin: 0.9 mg/dL (ref 0.3–1.2)
Total Protein: 6.4 g/dL — ABNORMAL LOW (ref 6.5–8.1)

## 2022-02-18 NOTE — MAU Note (Signed)
.  Valerie Rasmussen is a 31 y.o. at [redacted]w[redacted]d here in MAU reporting: Elevated BP in the OB office today, hand swelling and numbness, dizziness and floaters. Pt took her BP again at work 143/94 around 1830. Pt reports the hand swelling and numbness has been ongoing for the last 2 months. Pt report seeing floaters in the lobby. Pt report she did have a HA, but it is not gone. Pt denies DFM, VB, LOF, abnormal discharge, epigastric pain, and complications in the pregnancy.   Onset of complaint: today Pain score: 7/10 Hands Vitals:   02/18/22 2106  BP: 136/86  Pulse: (!) 106  Resp: 20  Temp: 98.6 F (37 C)  SpO2: 99%     FHT:140 Lab orders placed from triage:  UA

## 2022-02-18 NOTE — Progress Notes (Signed)
   LOW-RISK PREGNANCY VISIT Patient name: Valerie Rasmussen MRN [id number]  Date of birth: 1991/08/23 Chief Complaint:   Routine Prenatal Visit  History of Present Illness:   Valerie Rasmussen is a 31 y.o. G51P1001 female at [redacted]w[redacted]d with an Estimated Date of Delivery: 03/18/22 being seen today for ongoing management of a low-risk pregnancy.      12/23/2021    9:13 AM 09/24/2021   10:35 AM 05/23/2021   10:50 AM  Depression screen PHQ 2/9  Decreased Interest 1 0 1  Down, Depressed, Hopeless 0 0 0  PHQ - 2 Score 1 0 1  Altered sleeping 0 0 0  Tired, decreased energy 1 1 1   Change in appetite 0 0 0  Feeling bad or failure about yourself  0 0 0  Trouble concentrating 0 0 0  Moving slowly or fidgety/restless 0 0 0  Suicidal thoughts 0 0 0  PHQ-9 Score 2 1 2     Today she reports no complaints. Contractions: Not present. Vag. Bleeding: None.  Movement: Present. denies leaking of fluid. Review of Systems:   Pertinent items are noted in HPI Denies abnormal vaginal discharge w/ itching/odor/irritation, headaches, visual changes, shortness of breath, chest pain, abdominal pain, severe nausea/vomiting, or problems with urination or bowel movements unless otherwise stated above. Pertinent History Reviewed:  Reviewed past medical,surgical, social, obstetrical and family history.  Reviewed problem list, medications and allergies.  Physical Assessment:   Vitals:   02/18/22 1012 02/18/22 1027  BP: 139/88 (!) 142/87  Pulse: 99   Weight: (!) 302 lb (137 kg)   Body mass index is 47.3 kg/m.        Physical Examination:   General appearance: Well appearing, and in no distress  Mental status: Alert, oriented to person, place, and time  Skin: Warm & dry  Respiratory: Normal respiratory effort, no distress  Abdomen: Soft, gravid, nontender  Pelvic: Cervical exam performed  Dilation: Closed Effacement (%): Thick Station: -3  Extremities: Edema: Trace  Psych:  mood and affect appropriate  Fetal  Status: Fetal Heart Rate (bpm): 140 Fundal Height: 36 cm Movement: Present Presentation: Vertex  Chaperone:  02/20/22     No results found for this or any previous visit (from the past 24 hour(s)).   Assessment & Plan:  1) Low-risk pregnancy G2P1001 at [redacted]w[redacted]d with an Estimated Date of Delivery: 03/18/22   -Elevated blood pressure with h/o gHTN BP as above Encouraged pt to check at home Reviewed preeclampsia precautions   Meds: No orders of the defined types were placed in this encounter.  Labs/procedures today: GBS, GC/C  Plan:  Continue routine obstetrical care  Next visit: prefers in person    Reviewed: Preterm labor symptoms and general obstetric precautions including but not limited to vaginal bleeding, contractions, leaking of fluid and fetal movement were reviewed in detail with the patient.  All questions were answered. Pt has home bp cuff. Check bp weekly, let [redacted]w[redacted]d know if >140/90.   Follow-up: Return in about 1 week (around 02/25/2022) for LROB visit AND on Friday BP check with RN.  Orders Placed This Encounter  Procedures   Culture, beta strep (group b only)    02/27/2022, DO Attending Obstetrician & Gynecologist, [facility], [facility]

## 2022-02-18 NOTE — MAU Provider Note (Signed)
Chief Complaint:  Hypertension   Event Date/Time   First Provider Initiated Contact with Patient 02/18/22 2211     HPI: Valerie Rasmussen is a 31 y.o. G2P1001 at 52w0dwho presents to maternity admissions reporting swelling in her hands and feet, with elevated BPs at work.   Had an elevation in office yesterday and two at work .  Stands all day at work  Had preeclampsia with last pregnancy. Had headache and some floaters but they resolved. . She reports good fetal movement, denies LOF, vaginal bleeding, h/a, dizziness, n/v, diarrhea, constipation or fever/chills.   Hypertension This is a new problem. The current episode started yesterday. The problem has been waxing and waning since onset. Associated symptoms include peripheral edema. Pertinent negatives include no anxiety, blurred vision, chest pain, headaches (had one earlier which resolved without meds) or shortness of breath. There are no associated agents to hypertension. Risk factors: History of preeclampsia. Past treatments include nothing.    RN Note: Valerie Rasmussen is a 31 y.o. at [redacted]w[redacted]d here in MAU reporting: Elevated BP in the OB office today, hand swelling and numbness, dizziness and floaters. Pt took her BP again at work 143/94 around 1830. Pt reports the hand swelling and numbness has been ongoing for the last 2 months. Pt report seeing floaters in the lobby. Pt report she did have a HA, but it is not gone. Pt denies DFM, VB, LOF, abnormal discharge, epigastric pain, and complications in the pregnancy.     Past Medical History: Past Medical History:  Diagnosis Date   Pregnancy induced hypertension    Pregnant     Past obstetric history: OB History  Gravida Para Term Preterm AB Living  2 1 1  0 0 1  SAB IAB Ectopic Multiple Live Births  0 0 0 0 1    # Outcome Date GA Lbr Len/2nd Weight Sex Delivery Anes PTL Lv  2 Current           1 Term 03/18/13 [redacted]w[redacted]d / 00:07 2495 g M Vag-Spont EPI N LIV     Complications: Gestational  hypertension    Past Surgical History: Past Surgical History:  Procedure Laterality Date   NO PAST SURGERIES      Family History: Family History  Problem Relation Age of Onset   Glaucoma Mother    Heart attack Father    Cancer Sister    Heart Problems Brother    Cancer Maternal Aunt        breast   Diabetes Maternal Uncle    Heart attack Paternal Uncle    Stroke Maternal Grandmother    Hypertension Other    Coronary artery disease Other    Cancer Other    Heart disease Other     Social History: Social History   Tobacco Use   Smoking status: Former    Types: Cigarettes   Smokeless tobacco: Never   Tobacco comments:    drinks with alcohol  Vaping Use   Vaping Use: Never used  Substance Use Topics   Alcohol use: Not Currently    Comment: occ   Drug use: No    Types: Marijuana    Comment: not now    Allergies: No Known Allergies  Meds:  Medications Prior to Admission  Medication Sig Dispense Refill Last Dose   acetaminophen (TYLENOL) 500 MG tablet Take 500 mg by mouth every 6 (six) hours as needed.      aspirin 81 MG chewable tablet Chew 2 tablets (162 mg  total) by mouth daily. 60 tablet 7    Prenatal Vit-Fe Fumarate-FA (PRENATAL VITAMIN PO) Take by mouth.       I have reviewed patient's Past Medical Hx, Surgical Hx, Family Hx, Social Hx, medications and allergies.   ROS:  Review of Systems  Eyes:  Negative for blurred vision.  Respiratory:  Negative for shortness of breath.   Cardiovascular:  Negative for chest pain.  Neurological:  Negative for headaches (had one earlier which resolved without meds).   Other systems negative  Physical Exam  Patient Vitals for the past 24 hrs:  BP Temp Temp src Pulse Resp SpO2 Height Weight  02/18/22 2200 134/83 -- -- 95 -- -- -- --  02/18/22 2138 137/85 -- -- (!) 101 -- -- -- --  02/18/22 2106 136/86 98.6 F (37 C) Oral (!) 106 20 99 % 5\' 9"  (1.753 m) (!) 136.4 kg   Vitals:   02/18/22 2220 02/18/22 2231  02/18/22 2246 02/18/22 2301  BP: 137/87 115/65 133/78 134/79  Pulse: (!) 101 100 93 97  Resp:      Temp:      TempSrc:      SpO2:      Weight:      Height:        Constitutional: Well-developed, well-nourished female in no acute distress.  Cardiovascular: normal rate and rhythm Respiratory: normal effort, clear to auscultation bilaterally GI: Abd soft, non-tender, gravid appropriate for gestational age.   No rebound or guarding. MS: Extremities nontender, no edema, normal ROM Neurologic: Alert and oriented x 4.  GU: Neg CVAT.  FHT:  Baseline 140 , moderate variability, accelerations present, no decelerations Contractions: Irregular frequent contractions, not felt as painful by pt (pt states cervix was closed in office)   Labs: Results for orders placed or performed during the hospital encounter of 02/18/22 (from the past 24 hour(s))  Urinalysis, Routine w reflex microscopic     Status: Abnormal   Collection Time: 02/18/22  8:55 PM  Result Value Ref Range   Color, Urine YELLOW YELLOW   APPearance HAZY (A) CLEAR   Specific Gravity, Urine 1.019 1.005 - 1.030   pH 5.0 5.0 - 8.0   Glucose, UA NEGATIVE NEGATIVE mg/dL   Hgb urine dipstick NEGATIVE NEGATIVE   Bilirubin Urine NEGATIVE NEGATIVE   Ketones, ur NEGATIVE NEGATIVE mg/dL   Protein, ur NEGATIVE NEGATIVE mg/dL   Nitrite NEGATIVE NEGATIVE   Leukocytes,Ua NEGATIVE NEGATIVE  Protein / creatinine ratio, urine     Status: Abnormal   Collection Time: 02/18/22  9:23 PM  Result Value Ref Range   Creatinine, Urine 158.30 mg/dL   Total Protein, Urine 33 mg/dL   Protein Creatinine Ratio 0.21 (H) 0.00 - 0.15 mg/mg[Cre]  CBC     Status: Abnormal   Collection Time: 02/18/22  9:32 PM  Result Value Ref Range   WBC 10.0 4.0 - 10.5 K/uL   RBC 3.35 (L) 3.87 - 5.11 MIL/uL   Hemoglobin 11.0 (L) 12.0 - 15.0 g/dL   HCT 40.930.9 (L) 81.136.0 - 91.446.0 %   MCV 92.2 80.0 - 100.0 fL   MCH 32.8 26.0 - 34.0 pg   MCHC 35.6 30.0 - 36.0 g/dL   RDW 78.213.7  95.611.5 - 21.315.5 %   Platelets 181 150 - 400 K/uL   nRBC 0.0 0.0 - 0.2 %  Comprehensive metabolic panel     Status: Abnormal   Collection Time: 02/18/22  9:32 PM  Result Value Ref Range   Sodium 137  135 - 145 mmol/L   Potassium 3.8 3.5 - 5.1 mmol/L   Chloride 107 98 - 111 mmol/L   CO2 18 (L) 22 - 32 mmol/L   Glucose, Bld 83 70 - 99 mg/dL   BUN 7 6 - 20 mg/dL   Creatinine, Ser 0.86 0.44 - 1.00 mg/dL   Calcium 9.7 8.9 - 57.8 mg/dL   Total Protein 6.4 (L) 6.5 - 8.1 g/dL   Albumin 2.9 (L) 3.5 - 5.0 g/dL   AST 27 15 - 41 U/L   ALT 45 (H) 0 - 44 U/L   Alkaline Phosphatase 128 (H) 38 - 126 U/L   Total Bilirubin 0.9 0.3 - 1.2 mg/dL   GFR, Estimated >46 >96 mL/min   Anion gap 12 5 - 15    O/Positive/-- (01/18 1523)  Imaging:  No results found.  MAU Course/MDM: I have reviewed the triage vital signs and the nursing notes.   Pertinent labs & imaging results that were available during my care of the patient were reviewed by me and considered in my medical decision making (see chart for details).      I have reviewed her medical records including past results, notes and treatments.   I have ordered labs and reviewed results.  NST reviewed, reactive  Consult Dr Alysia Penna with presentation, exam findings and test results.   He recommends discharge home with preeclampsia precautions.  Recommends IOL at 37 weeks unless she develops signs of preeclampsia Treatments in MAU included EFM.    Assessment: Single IUP at [redacted]w[redacted]d Gestational Hypertension   Plan: Discharge home Preeclampsia precautions reviewed in detail Labor precautions and fetal kick counts Follow up in Office for prenatal visits and recheck Encouraged to return if she develops worsening of symptoms, increase in pain, fever, or other concerning symptoms.  Pt stable at time of discharge.  [name] CNM, MSN Certified Nurse-Midwife 02/18/2022 10:11 PM

## 2022-02-19 DIAGNOSIS — O139 Gestational [pregnancy-induced] hypertension without significant proteinuria, unspecified trimester: Secondary | ICD-10-CM | POA: Diagnosis present

## 2022-02-19 LAB — CERVICOVAGINAL ANCILLARY ONLY
Chlamydia: NEGATIVE
Comment: NEGATIVE
Comment: NORMAL
Neisseria Gonorrhea: NEGATIVE

## 2022-02-20 ENCOUNTER — Ambulatory Visit (INDEPENDENT_AMBULATORY_CARE_PROVIDER_SITE_OTHER): Payer: BC Managed Care – PPO | Admitting: *Deleted

## 2022-02-20 VITALS — BP 133/87 | HR 111

## 2022-02-20 DIAGNOSIS — O133 Gestational [pregnancy-induced] hypertension without significant proteinuria, third trimester: Secondary | ICD-10-CM

## 2022-02-20 NOTE — Progress Notes (Signed)
   NURSE VISIT- BLOOD PRESSURE CHECK  SUBJECTIVE:  Valerie Rasmussen is a 31 y.o. G40P1001 female here for BP check. She is [redacted]w[redacted]d pregnant    HYPERTENSION ROS:  Pregnant/postpartum:  Severe headaches that don't go away with tylenol/other medicines: No  Visual changes (seeing spots/double/blurred vision) No  Severe pain under right breast breast or in center of upper chest No  Severe nausea/vomiting No  Taking medicines as instructed not applicable   OBJECTIVE:  BP 133/87   Pulse (!) 111   LMP 06/22/2021 (Approximate)   Appearance alert, well appearing, and in no distress.  ASSESSMENT: Pregnancy [redacted]w[redacted]d  blood pressure check  PLAN: Discussed with Dr. Despina Hidden   Recommendations: no changes needed   Follow-up: as scheduled   Annamarie Dawley  02/20/2022 11:19 AM

## 2022-02-22 LAB — CULTURE, BETA STREP (GROUP B ONLY): Strep Gp B Culture: POSITIVE — AB

## 2022-02-23 ENCOUNTER — Encounter (HOSPITAL_COMMUNITY): Payer: Self-pay | Admitting: *Deleted

## 2022-02-23 ENCOUNTER — Encounter: Payer: Self-pay | Admitting: Obstetrics and Gynecology

## 2022-02-23 ENCOUNTER — Encounter (HOSPITAL_COMMUNITY): Payer: Self-pay

## 2022-02-23 ENCOUNTER — Ambulatory Visit (INDEPENDENT_AMBULATORY_CARE_PROVIDER_SITE_OTHER): Payer: BC Managed Care – PPO | Admitting: Obstetrics and Gynecology

## 2022-02-23 ENCOUNTER — Telehealth (HOSPITAL_COMMUNITY): Payer: Self-pay | Admitting: *Deleted

## 2022-02-23 VITALS — BP 129/89 | HR 107 | Wt 301.0 lb

## 2022-02-23 DIAGNOSIS — O99820 Streptococcus B carrier state complicating pregnancy: Secondary | ICD-10-CM | POA: Insufficient documentation

## 2022-02-23 DIAGNOSIS — O0993 Supervision of high risk pregnancy, unspecified, third trimester: Secondary | ICD-10-CM

## 2022-02-23 DIAGNOSIS — O133 Gestational [pregnancy-induced] hypertension without significant proteinuria, third trimester: Secondary | ICD-10-CM

## 2022-02-23 LAB — POCT URINALYSIS DIPSTICK OB
Blood, UA: NEGATIVE
Glucose, UA: NEGATIVE
Ketones, UA: NEGATIVE
Leukocytes, UA: NEGATIVE
Nitrite, UA: NEGATIVE
POC,PROTEIN,UA: NEGATIVE

## 2022-02-23 NOTE — Telephone Encounter (Signed)
Preadmission screen  

## 2022-02-23 NOTE — Progress Notes (Signed)
Subjective:  Valerie Rasmussen is a 31 y.o. G2P1001 at [redacted]w[redacted]d being seen today for ongoing prenatal care.  She is currently monitored for the following issues for this high-risk pregnancy and has Left breast mass; Supervision of high-risk pregnancy; History of gestational hypertension; Obesity in pregnancy; and Gestational hypertension on their problem list.  Patient reports general discomforts of pregnancy. Denies HA or visual changes.  Contractions: Not present. Vag. Bleeding: None.  Movement: Present. Denies leaking of fluid.   The following portions of the patient's history were reviewed and updated as appropriate: allergies, current medications, past family history, past medical history, past social history, past surgical history and problem list. Problem list updated.  Objective:   Vitals:   02/23/22 0838  BP: 129/89  Pulse: (!) 107  Weight: (!) 301 lb (136.5 kg)    Fetal Status:     Movement: Present     General:  Alert, oriented and cooperative. Patient is in no acute distress.  Skin: Skin is warm and dry. No rash noted.   Cardiovascular: Normal heart rate noted  Respiratory: Normal respiratory effort, no problems with respiration noted  Abdomen: Soft, gravid, appropriate for gestational age. Pain/Pressure: Absent     Pelvic:  Cervical exam deferred        Extremities: Normal range of motion.  Edema: Trace  Mental Status: Normal mood and affect. Normal behavior. Normal judgment and thought content.   Urinalysis:      Assessment and Plan:  Pregnancy: G2P1001 at [redacted]w[redacted]d  1. Supervision of high risk pregnancy in third trimester Stable IOL scheduled - POC Urinalysis [facility]  2. Gestational hypertension, third trimester BP stable, No S/Sx of PEC IOL scheduled - POC Urinalysis [facility]  Term labor symptoms and general obstetric precautions including but not limited to vaginal bleeding, contractions, leaking of fluid and fetal movement were reviewed in detail with the  patient. Please refer to After Visit Summary for other counseling recommendations.  Return in about 2 weeks (around 03/09/2022).   Hermina Staggers, MD

## 2022-02-23 NOTE — Patient Instructions (Signed)
Vaginal Delivery  Vaginal delivery means that you give birth by pushing your baby out of your birth canal (vagina). Your health care team will help you before, during, and after vaginal delivery. Birth experiences are unique for every woman and every pregnancy, and birth experiences vary depending on where you choose to give birth. What are the risks and benefits? Generally, this is safe. However, problems may occur, including: Bleeding. Infection. Damage to other structures such as vaginal tearing. Allergic reactions to medicines. Despite the risks, benefits of vaginal delivery include less risk of bleeding and infection and a shorter recovery time compared to a Cesarean delivery. Cesarean delivery, or C-section, is the surgical delivery of a baby. What happens when I arrive at the birth center or hospital? Once you are in labor and have been admitted into the hospital or birth center, your health care team may: Review your pregnancy history and any concerns that you have. Talk with you about your birth plan and discuss pain control options. Check your blood pressure, breathing, and heartbeat. Assess your baby's heartbeat. Monitor your uterus for contractions. Check whether your bag of water (amniotic sac) has broken (ruptured). Insert an IV into one of your veins. This may be used to give you fluids and medicines. Monitoring Your health care team may assess your contractions (uterine monitoring) and your baby's heart rate (fetal monitoring). You may need to be monitored: Often, but not continuously (intermittently). All the time or for long periods at a time (continuously). Continuous monitoring may be needed if: You are taking certain medicines, such as medicine to relieve pain or make your contractions stronger. You have pregnancy or labor complications. Monitoring may be done by: Placing a special stethoscope or a handheld monitoring device on your abdomen to check your baby's  heartbeat and to check for contractions. Placing monitors on your abdomen (external monitors) to record your baby's heartbeat and the frequency and length of contractions. Placing monitors inside your uterus through your vagina (internal monitors) to record your baby's heartbeat and the frequency, length, and strength of your contractions. Depending on the type of monitor, it may remain in your uterus or on your baby's head until birth. Telemetry. This is a type of continuous monitoring that can be done with external or internal monitors. Instead of having to stay in bed, you are able to move around. Physical exam Your health care team may perform frequent physical exams. This may include: Checking how and where your baby is positioned in your uterus. Checking your cervix to determine: Whether it is thinning out (effacing). Whether it is opening up (dilating). What happens during labor and delivery?  Normal labor and delivery is divided into the following three stages: Stage 1 This is the longest stage of labor. Throughout this stage, you will feel contractions. Contractions generally feel mild, infrequent, and irregular at first. They get stronger, more frequent, and more regular as you move through this stage. You may have contractions about every 2-3 minutes. This stage ends when your cervix is completely dilated to 4 inches (10 cm) and completely effaced. Stage 2 This stage starts once your cervix is completely effaced and dilated and lasts until the delivery of your baby. This is the stage where you will feel an urge to push your baby out of your vagina. You may feel stretching and burning pain, especially when the widest part of your baby's head passes through the vaginal opening (crowning). Once your baby is delivered, the umbilical cord will be   clamped and cut. Timing of cutting the cord will depend on your wishes, your baby's health, and your health care provider's practices. Your baby  will be placed on your bare chest (skin-to-skin contact) in an upright position and covered with a warm blanket. If you are choosing to breastfeed, watch your baby for feeding cues, like rooting or sucking, and help the baby to your breast for his or her first feeding. Stage 3 This stage starts immediately after the birth of your baby and ends after you deliver the placenta. This stage may take anywhere from 5 to 30 minutes. After your baby has been delivered, you will feel contractions as your body expels the placenta. These contractions also help your uterus get smaller and reduce bleeding. What can I expect after labor and delivery? After labor is over, you and your baby will be assessed closely until you are ready to go home. Your health care team will teach you how to care for yourself and your baby. You and your baby may be encouraged to stay in the same room (rooming in) during your hospital stay. This will help promote early bonding and successful breastfeeding. Your uterus will be checked and massaged regularly (fundal massage). You may continue to receive fluids and medicines through an IV. You will have some soreness and pain in your abdomen, vagina, and the area of skin between your vaginal opening and your anus (perineum). If an incision was made near your vagina (episiotomy) or if you had some vaginal tearing during delivery, cold compresses may be placed on your episiotomy or your tear. This helps to reduce pain and swelling. It is normal to have vaginal bleeding after delivery. Wear a sanitary pad for vaginal bleeding and discharge. Summary Vaginal delivery means that you will give birth by pushing your baby out of your birth canal (vagina). Your health care team will monitor you and your baby throughout the stages of labor. After you deliver your baby, your health care team will continue to assess you and your baby to ensure you are both recovering as expected after delivery. This  information is not intended to replace advice given to you by your health care provider. Make sure you discuss any questions you have with your health care provider. Document Revised: 07/22/2020 Document Reviewed: 07/22/2020 Elsevier Patient Education  2023 Elsevier Inc.  

## 2022-02-24 ENCOUNTER — Encounter: Payer: BC Managed Care – PPO | Admitting: Women's Health

## 2022-02-25 ENCOUNTER — Encounter (HOSPITAL_COMMUNITY): Payer: Self-pay | Admitting: Obstetrics and Gynecology

## 2022-02-25 ENCOUNTER — Inpatient Hospital Stay (HOSPITAL_COMMUNITY): Payer: BC Managed Care – PPO | Admitting: Anesthesiology

## 2022-02-25 ENCOUNTER — Inpatient Hospital Stay (HOSPITAL_COMMUNITY): Payer: BC Managed Care – PPO

## 2022-02-25 ENCOUNTER — Inpatient Hospital Stay (HOSPITAL_COMMUNITY)
Admission: AD | Admit: 2022-02-25 | Discharge: 2022-02-28 | DRG: 807 | Disposition: A | Discharge status: Home / Self Care | Payer: BC Managed Care – PPO | Attending: Obstetrics and Gynecology | Admitting: Obstetrics and Gynecology

## 2022-02-25 ENCOUNTER — Other Ambulatory Visit: Payer: Self-pay

## 2022-02-25 DIAGNOSIS — O0993 Supervision of high risk pregnancy, unspecified, third trimester: Secondary | ICD-10-CM

## 2022-02-25 DIAGNOSIS — Z87891 Personal history of nicotine dependence: Secondary | ICD-10-CM | POA: Diagnosis not present

## 2022-02-25 DIAGNOSIS — Z3A37 37 weeks gestation of pregnancy: Secondary | ICD-10-CM

## 2022-02-25 DIAGNOSIS — O99820 Streptococcus B carrier state complicating pregnancy: Secondary | ICD-10-CM | POA: Diagnosis not present

## 2022-02-25 DIAGNOSIS — O99824 Streptococcus B carrier state complicating childbirth: Secondary | ICD-10-CM | POA: Diagnosis not present

## 2022-02-25 DIAGNOSIS — O134 Gestational [pregnancy-induced] hypertension without significant proteinuria, complicating childbirth: Principal | ICD-10-CM | POA: Diagnosis present

## 2022-02-25 DIAGNOSIS — O99214 Obesity complicating childbirth: Secondary | ICD-10-CM | POA: Diagnosis not present

## 2022-02-25 DIAGNOSIS — O139 Gestational [pregnancy-induced] hypertension without significant proteinuria, unspecified trimester: Secondary | ICD-10-CM | POA: Diagnosis present

## 2022-02-25 DIAGNOSIS — O133 Gestational [pregnancy-induced] hypertension without significant proteinuria, third trimester: Secondary | ICD-10-CM

## 2022-02-25 LAB — CBC
HCT: 31 % — ABNORMAL LOW (ref 36.0–46.0)
HCT: 33.5 % — ABNORMAL LOW (ref 36.0–46.0)
Hemoglobin: 10.9 g/dL — ABNORMAL LOW (ref 12.0–15.0)
Hemoglobin: 12.1 g/dL (ref 12.0–15.0)
MCH: 32.9 pg (ref 26.0–34.0)
MCH: 33.2 pg (ref 26.0–34.0)
MCHC: 35.2 g/dL (ref 30.0–36.0)
MCHC: 36.1 g/dL — ABNORMAL HIGH (ref 30.0–36.0)
MCV: 93.7 fL (ref 80.0–100.0)
MCV: 91.8 fL (ref 80.0–100.0)
Platelets: 173 10*3/uL (ref 150–400)
Platelets: 159 10*3/uL (ref 150–400)
RBC: 3.31 MIL/uL — ABNORMAL LOW (ref 3.87–5.11)
RBC: 3.65 MIL/uL — ABNORMAL LOW (ref 3.87–5.11)
RDW: 14 % (ref 11.5–15.5)
RDW: 13.7 % (ref 11.5–15.5)
WBC: 7.8 10*3/uL (ref 4.0–10.5)
WBC: 15.1 10*3/uL — ABNORMAL HIGH (ref 4.0–10.5)
nRBC: 0 % (ref 0.0–0.2)
nRBC: 0 % (ref 0.0–0.2)

## 2022-02-25 LAB — COMPREHENSIVE METABOLIC PANEL
ALT: 51 U/L — ABNORMAL HIGH (ref 0–44)
AST: 31 U/L (ref 15–41)
Albumin: 2.7 g/dL — ABNORMAL LOW (ref 3.5–5.0)
Alkaline Phosphatase: 133 U/L — ABNORMAL HIGH (ref 38–126)
Anion gap: 7 (ref 5–15)
BUN: 7 mg/dL (ref 6–20)
CO2: 19 mmol/L — ABNORMAL LOW (ref 22–32)
Calcium: 9.4 mg/dL (ref 8.9–10.3)
Chloride: 110 mmol/L (ref 98–111)
Creatinine, Ser: 0.57 mg/dL (ref 0.44–1.00)
GFR, Estimated: >60 mL/min (ref >60)
Glucose, Bld: 96 mg/dL (ref 70–99)
Potassium: 3.7 mmol/L (ref 3.5–5.1)
Sodium: 136 mmol/L (ref 135–145)
Total Bilirubin: 0.7 mg/dL (ref 0.3–1.2)
Total Protein: 6 g/dL — ABNORMAL LOW (ref 6.5–8.1)

## 2022-02-25 LAB — TYPE AND SCREEN
ABO/RH(D): O POS
Antibody Screen: NEGATIVE

## 2022-02-25 LAB — PROTEIN / CREATININE RATIO, URINE
Creatinine, Urine: 109.41 mg/dL
Protein Creatinine Ratio: 0.18 mg/mg{Cre} — ABNORMAL HIGH (ref 0.00–0.15)
Total Protein, Urine: 20 mg/dL

## 2022-02-25 LAB — RPR: RPR Ser Ql: NONREACTIVE

## 2022-02-25 MED ORDER — TERBUTALINE SULFATE 1 MG/ML IJ SOLN
0.2500 mg | Freq: Once | INTRAMUSCULAR | Status: AC | PRN
  Administered 2022-02-25: 0.25 mg via SUBCUTANEOUS
  Filled 2022-02-25: qty 1

## 2022-02-25 MED ORDER — OXYTOCIN BOLUS FROM INFUSION
333.0000 mL | Freq: Once | INTRAVENOUS | Status: AC
  Administered 2022-02-26: 333 mL via INTRAVENOUS

## 2022-02-25 MED ORDER — GENTAMICIN SULFATE 40 MG/ML IJ SOLN
5.0000 mg/kg | INTRAVENOUS | Status: DC
  Administered 2022-02-26: 470 mg via INTRAVENOUS
  Filled 2022-02-25: qty 11.75

## 2022-02-25 MED ORDER — LACTATED RINGERS AMNIOINFUSION: INTRAVENOUS | Status: DC

## 2022-02-25 MED ORDER — ONDANSETRON HCL 4 MG/2ML IJ SOLN: 4.0000 mg | Freq: Four times a day (QID) | INTRAMUSCULAR | Status: DC | PRN

## 2022-02-25 MED ORDER — OXYCODONE-ACETAMINOPHEN 5-325 MG PO TABS: 2.0000 | ORAL_TABLET | ORAL | Status: DC | PRN

## 2022-02-25 MED ORDER — OXYCODONE-ACETAMINOPHEN 5-325 MG PO TABS: 1.0000 | ORAL_TABLET | ORAL | Status: DC | PRN

## 2022-02-25 MED ORDER — SOD CITRATE-CITRIC ACID 500-334 MG/5ML PO SOLN
30.0000 mL | ORAL | Status: DC | PRN
  Filled 2022-02-25: qty 30

## 2022-02-25 MED ORDER — FENTANYL-BUPIVACAINE-NACL 0.5-0.125-0.9 MG/250ML-% EP SOLN
12.0000 mL/h | EPIDURAL | Status: DC | PRN
  Administered 2022-02-25: 12 mL/h via EPIDURAL
  Filled 2022-02-25 (×2): qty 250

## 2022-02-25 MED ORDER — PHENYLEPHRINE 80 MCG/ML (10ML) SYRINGE FOR IV PUSH (FOR BLOOD PRESSURE SUPPORT)
80.0000 ug | PREFILLED_SYRINGE | INTRAVENOUS | Status: DC | PRN
  Filled 2022-02-25: qty 10

## 2022-02-25 MED ORDER — SODIUM CHLORIDE 0.9 % IV SOLN
2.0000 g | Freq: Four times a day (QID) | INTRAVENOUS | Status: DC
  Administered 2022-02-25 – 2022-02-26 (×2): 2 g via INTRAVENOUS
  Filled 2022-02-25 (×2): qty 2000

## 2022-02-25 MED ORDER — EPHEDRINE 5 MG/ML INJ: 10.0000 mg | INTRAVENOUS | Status: DC | PRN

## 2022-02-25 MED ORDER — OXYTOCIN-SODIUM CHLORIDE 30-0.9 UT/500ML-% IV SOLN
1.0000 m[IU]/min | INTRAVENOUS | Status: DC
  Administered 2022-02-25 – 2022-02-26 (×2): 2 m[IU]/min via INTRAVENOUS
  Filled 2022-02-25: qty 500

## 2022-02-25 MED ORDER — LACTATED RINGERS IV SOLN
INTRAVENOUS | Status: DC
  Administered 2022-02-25: 125 mL/h via INTRAVENOUS

## 2022-02-25 MED ORDER — LACTATED RINGERS IV SOLN: 500.0000 mL | Freq: Once | INTRAVENOUS | Status: DC

## 2022-02-25 MED ORDER — BUPIVACAINE HCL (PF) 0.25 % IJ SOLN
INTRAMUSCULAR | Status: DC | PRN
  Administered 2022-02-25: 8 mL via EPIDURAL

## 2022-02-25 MED ORDER — OXYTOCIN-SODIUM CHLORIDE 30-0.9 UT/500ML-% IV SOLN: 2.5000 [IU]/h | INTRAVENOUS | Status: DC

## 2022-02-25 MED ORDER — SODIUM CHLORIDE 0.9 % IV SOLN
5.0000 10*6.[IU] | Freq: Once | INTRAVENOUS | Status: AC
  Administered 2022-02-25: 5 10*6.[IU] via INTRAVENOUS
  Filled 2022-02-25: qty 5

## 2022-02-25 MED ORDER — DIPHENHYDRAMINE HCL 50 MG/ML IJ SOLN: 12.5000 mg | INTRAMUSCULAR | Status: DC | PRN

## 2022-02-25 MED ORDER — MISOPROSTOL 50MCG HALF TABLET
50.0000 ug | ORAL_TABLET | ORAL | Status: DC | PRN
  Administered 2022-02-25: 50 ug via BUCCAL
  Filled 2022-02-25: qty 1

## 2022-02-25 MED ORDER — ACETAMINOPHEN 325 MG PO TABS
650.0000 mg | ORAL_TABLET | ORAL | Status: DC | PRN
  Administered 2022-02-25 – 2022-02-26 (×2): 650 mg via ORAL
  Filled 2022-02-25 (×2): qty 2

## 2022-02-25 MED ORDER — LIDOCAINE HCL (PF) 1 % IJ SOLN
INTRAMUSCULAR | Status: DC | PRN
  Administered 2022-02-25: 5 mL via EPIDURAL
  Administered 2022-02-25: 4 mL via EPIDURAL

## 2022-02-25 MED ORDER — LIDOCAINE HCL (PF) 1 % IJ SOLN: 30.0000 mL | INTRAMUSCULAR | Status: DC | PRN

## 2022-02-25 MED ORDER — FENTANYL CITRATE (PF) 100 MCG/2ML IJ SOLN
50.0000 ug | INTRAMUSCULAR | Status: DC | PRN
  Administered 2022-02-25 (×2): 100 ug via INTRAVENOUS
  Filled 2022-02-25 (×2): qty 2

## 2022-02-25 MED ORDER — PENICILLIN G POT IN DEXTROSE 60000 UNIT/ML IV SOLN
3.0000 10*6.[IU] | INTRAVENOUS | Status: DC
  Administered 2022-02-25 (×3): 3 10*6.[IU] via INTRAVENOUS
  Filled 2022-02-25 (×3): qty 50

## 2022-02-25 MED ORDER — LACTATED RINGERS IV SOLN
500.0000 mL | INTRAVENOUS | Status: DC | PRN
  Administered 2022-02-25: 500 mL via INTRAVENOUS
  Administered 2022-02-25 – 2022-02-26 (×2): 1000 mL via INTRAVENOUS

## 2022-02-25 MED ORDER — PHENYLEPHRINE 80 MCG/ML (10ML) SYRINGE FOR IV PUSH (FOR BLOOD PRESSURE SUPPORT): 80.0000 ug | PREFILLED_SYRINGE | INTRAVENOUS | Status: DC | PRN

## 2022-02-25 NOTE — Progress Notes (Signed)
Valerie Rasmussen is a 31 y.o. G2P1001 at [redacted]w[redacted]d by ultrasound admitted for induction of labor due to Hypertension.  Subjective: Patient doing well. Denies any concerns at this time. Progressing appropriately.   Objective: BP (!) 147/78   Pulse 100   Temp 97.9 F (36.6 C) (Oral)   Resp 16   Ht 5\' 9"  (1.753 m)   Wt (!) 137.3 kg   LMP 06/22/2021 (Approximate)   BMI 44.70 kg/m  No intake/output data recorded. No intake/output data recorded.  FHT:  FHR: 140 bpm, variability: moderate,  accelerations:  Present,  decelerations:  Absent UC:   regular, every 1-3 minutes SVE:   Dilation: 4 Effacement (%): 60 Station: -1 Exam by:: [email or web address] MD  Labs: Lab Results  Component Value Date   WBC 7.8 02/25/2022   HGB 10.9 (L) 02/25/2022   HCT 31.0 (L) 02/25/2022   MCV 93.7 02/25/2022   PLT 173 02/25/2022    Assessment / Plan: Augmentation of labor, progressing well  Labor:  Progressing appropriately. AROM performed at 1527 Preeclampsia:   n/a Fetal Wellbeing:  Category I Pain Control:  Epidural and IV pain meds I/D:   GBS+ s/p penicillin Anticipated MOD:  NSVD  02/27/2022 02/25/2022, 3:39 PM

## 2022-02-25 NOTE — Anesthesia Procedure Notes (Signed)
Epidural Patient location during procedure: OB Start time: 02/25/2022 6:21 PM End time: 02/25/2022 6:25 PM  Staffing Anesthesiologist: Beryle Lathe, MD Performed: anesthesiologist   Preanesthetic Checklist Completed: patient identified, IV checked, risks and benefits discussed, monitors and equipment checked, pre-op evaluation and timeout performed  Epidural Patient position: sitting Prep: DuraPrep Patient monitoring: continuous pulse ox and blood pressure Approach: midline Location: L3-L4 Injection technique: LOR saline  Needle:  Needle type: Tuohy  Needle gauge: 17 G Needle length: 9 cm Needle insertion depth: 9 cm Catheter size: 19 Gauge Catheter at skin depth: 14 cm Test dose: negative and Other (1% lidocaine)  Assessment Events: blood not aspirated  Additional Notes Patient identified. Risks including, but not limited to, bleeding, infection, nerve damage, paralysis, inadequate analgesia, blood pressure changes, nausea, vomiting, allergic reaction, postpartum back pain, itching, and headache were discussed. Patient expressed understanding and wished to proceed. Sterile prep and drape, including hand hygiene, mask, and sterile gloves were used. The patient was positioned and the spine was prepped. The skin was anesthetized with lidocaine. No paraesthesia or other complication noted. The patient did not experience any signs of intravascular injection such as tinnitus or metallic taste in mouth, nor signs of intrathecal spread such as rapid motor block. Please see nursing notes for vital signs. The patient tolerated the procedure well.   Leslye Peer, MDReason for block:procedure for pain

## 2022-02-25 NOTE — Progress Notes (Signed)
Patient ID: Valerie Rasmussen, female   DOB: March 12, 1991, 31 y.o.   MRN: [id number] Doing well Comfortable with epidural  Vitals:   02/25/22 1950 02/25/22 2001 02/25/22 2031 02/25/22 2046  BP:  (!) 141/70 (!) 161/93 (!) 128/59  Pulse:  97 (!) 112 (!) 112  Resp:   20   Temp:      TempSrc:      SpO2: 100%     Weight:      Height:       FHR 140s, avg variability  Last Cervix exam:  Dilation: 5 Effacement (%): 70 Cervical Position: Middle Station: -1 Presentation: Vertex Exam by:: Luz Brazen RN  New variable decels noted:  will insert IUPC and start amnioinfusion

## 2022-02-25 NOTE — Progress Notes (Signed)
Patient ID: Valerie Rasmussen, female   DOB: Jul 04, 1991, 31 y.o.   MRN: [id number] Variable decelerations have returned and deepened  Multiple position changes did not improve them much  Baseline variability remains good UCs are somewhat spaced out  Dilation: 6.5 Effacement (%): 80 Cervical Position: Middle Station: -2 Presentation: Vertex Exam by:: [place] B. RN  There is caput, ?asynclytic  Consulted Dr Debroah Loop, will give a dose of Terbutaline to rest fetus then see how things progress

## 2022-02-25 NOTE — Anesthesia Preprocedure Evaluation (Addendum)
Anesthesia Evaluation  Patient identified by MRN, date of birth, ID band Patient awake    Reviewed: Allergy & Precautions, NPO status , Patient's Chart, lab work & pertinent test results  History of Anesthesia Complications Negative for: history of anesthetic complications  Airway Mallampati: III   Neck ROM: Full    Dental   Pulmonary former smoker,    Pulmonary exam normal        Cardiovascular hypertension (PIH), Normal cardiovascular exam     Neuro/Psych negative neurological ROS  negative psych ROS   GI/Hepatic negative GI ROS,  Elevated LFTs    Endo/Other  Morbid obesity  Renal/GU negative Renal ROS     Musculoskeletal negative musculoskeletal ROS (+)   Abdominal (+) + obese,   Peds  Hematology negative hematology ROS (+)   Anesthesia Other Findings   Reproductive/Obstetrics (+) Pregnancy                            Anesthesia Physical Anesthesia Plan  ASA: 3  Anesthesia Plan: Epidural   Post-op Pain Management:    Induction:   PONV Risk Score and Plan: 2 and Treatment may vary due to age or medical condition  Airway Management Planned: Natural Airway  Additional Equipment: None  Intra-op Plan:   Post-operative Plan:   Informed Consent: I have reviewed the patients History and Physical, chart, labs and discussed the procedure including the risks, benefits and alternatives for the proposed anesthesia with the patient or authorized representative who has indicated his/her understanding and acceptance.       Plan Discussed with: Anesthesiologist  Anesthesia Plan Comments: (Labs reviewed. Platelets acceptable, patient not taking any blood thinning medications. Per RN, FHR tracing reported to be stable enough for sitting procedure. Risks and benefits discussed with patient, including PDPH, backache, epidural hematoma, failed epidural, blood pressure changes, allergic  reaction, and nerve injury. Patient expressed understanding and wished to proceed.)        Anesthesia Quick Evaluation

## 2022-02-25 NOTE — Progress Notes (Signed)
Patient ID: Valerie Rasmussen, female   DOB: 1991/01/28, 31 y.o.   MRN: [id number] Decelerations improved since turning off Pitocin (for ) and amnioinfusion  Now has fever 100.4  Will add Gentamycin and give Tylenol  FHR reassuring and UCs remain adequate

## 2022-02-25 NOTE — Progress Notes (Signed)
ANTIBIOTIC CONSULT NOTE - INITIAL  Pharmacy Consult for Gentamicin Indication: Chorioamnionitis   No Known Allergies  Patient Measurements: Height: 5\' 9"  (175.3 cm) Weight: (!) 137.3 kg (302 lb 11.2 oz) IBW/kg (Calculated) : 66.2 Adjusted Body Weight: 94.6 kg  Vital Signs: Temp: 100.8 F (38.2 C) (06/21 2202) Temp Source: Axillary (06/21 2202) BP: 157/72 (06/21 2215) Pulse Rate: 135 (06/21 2215)  Labs: Recent Labs    02/25/22 0659 02/25/22 1749  WBC 7.8 15.1*  HGB 10.9* 12.1  PLT 173 159  LABCREA 109.41  --   CREATININE 0.57  --    No results for input(s): "GENTTROUGH", "GENTPEAK", "GENTRANDOM" in the last 72 hours.   Microbiology: Recent Results (from the past 720 hour(s))  Culture, beta strep (group b only)     Status: Abnormal   Collection Time: 02/18/22  1:45 PM   Specimen: Vaginal/Rectal; Genital   VR  Result Value Ref Range Status   Strep Gp B Culture Positive (A) Negative Final    Comment: Centers for Disease Control and Prevention (CDC) and American Congress of Obstetricians and Gynecologists (ACOG) guidelines for prevention of perinatal group B streptococcal (GBS) disease specify co-collection of a vaginal and rectal swab specimen to maximize sensitivity of GBS detection. Per the CDC and ACOG, swabbing both the lower vagina and rectum substantially increases the yield of detection compared with sampling the vagina alone. Penicillin G, ampicillin, or cefazolin are indicated for intrapartum prophylaxis of perinatal GBS colonization. Reflex susceptibility testing should be performed prior to use of clindamycin only on GBS isolates from penicillin-allergic women who are considered a high risk for anaphylaxis. Treatment with vancomycin without additional testing is warranted if resistance to clindamycin is noted.     Medications:  Ampicillin 2gm IV q6hr  Goal of Therapy:  Gentamicin peak 20-25 mg/L and Trough < 1 mg/L  Plan:  Gentamicin 470 mg (5  mg/kg) IV every 24 hrs  Check Scr with next labs if gentamicin continued. Will check gentamicin levels if continued > 72hr or clinically indicated.  02/20/22 02/25/2022,10:23 PM

## 2022-02-25 NOTE — H&P (Cosign Needed)
Valerie Rasmussen is a 31 y.o. G58P1001 female at [redacted]w[redacted]d by 11wks presenting for IOL d/t GHTN. Denies ha, visual changes, ruq/epigastric pain, n/v.     Reports active fetal movement, contractions: none, vaginal bleeding: none, membranes: intact.  Initiated prenatal care at [facility] at 15 wks.    This pregnancy complicated by: GHTN   Prenatal History/Complications:  H/O GHTN  Past Medical History: Past Medical History:  Diagnosis Date   Pregnancy induced hypertension    Pregnant     Past Surgical History: Past Surgical History:  Procedure Laterality Date   NO PAST SURGERIES      Obstetrical History: OB History     Gravida  2   Para  1   Term  1   Preterm  0   AB  0   Living  1      SAB  0   IAB  0   Ectopic  0   Multiple  0   Live Births  1           Social History: Social History   Socioeconomic History   Marital status: Single    Spouse name: Not on file   Number of children: Not on file   Years of education: Not on file   Highest education level: Not on file  Occupational History   Not on file  Tobacco Use   Smoking status: Former    Types: Cigarettes   Smokeless tobacco: Never   Tobacco comments:    drinks with alcohol  Vaping Use   Vaping Use: Never used  Substance and Sexual Activity   Alcohol use: Not Currently    Comment: occ   Drug use: No    Types: Marijuana    Comment: not now   Sexual activity: Yes  Other Topics Concern   Not on file  Social History Narrative   Not on file   Social Determinants of Health   Financial Resource Strain: Low Risk  (12/23/2021)   Overall Financial Resource Strain (CARDIA)    Difficulty of Paying Living Expenses: Not hard at all  Food Insecurity: No Food Insecurity (12/23/2021)   Hunger Vital Sign    Worried About Running Out of Food in the Last Year: Never true    Ran Out of Food in the Last Year: Never true  Transportation Needs: No Transportation Needs (12/23/2021)   PRAPARE -  Administrator, Civil Service (Medical): No    Lack of Transportation (Non-Medical): No  Physical Activity: Insufficiently Active (12/23/2021)   Exercise Vital Sign    Days of Exercise per Week: 5 days    Minutes of Exercise per Session: 20 min  Stress: No Stress Concern Present (12/23/2021)   [employer] of Occupational Health - Occupational Stress Questionnaire    Feeling of Stress : Not at all  Social Connections: Moderately Isolated (12/23/2021)   Social Connection and Isolation Panel [NHANES]    Frequency of Communication with Friends and Family: Twice a week    Frequency of Social Gatherings with Friends and Family: Once a week    Attends Religious Services: 1 to 4 times per year    Active Member of [employer] or Organizations: No    Attends Banker Meetings: Never    Marital Status: Never married    Family History: Family History  Problem Relation Age of Onset   Glaucoma Mother    Heart attack Father    Cancer Sister    Heart  Problems Brother    Cancer Maternal Aunt        breast   Diabetes Maternal Uncle    Heart attack Paternal Uncle    Stroke Maternal Grandmother    Hypertension Other    Coronary artery disease Other    Cancer Other    Heart disease Other     Allergies: No Known Allergies  Medications Prior to Admission  Medication Sig Dispense Refill Last Dose   acetaminophen (TYLENOL) 500 MG tablet Take 500 mg by mouth every 6 (six) hours as needed.      aspirin 81 MG chewable tablet Chew 2 tablets (162 mg total) by mouth daily. 60 tablet 7    Prenatal Vit-Fe Fumarate-FA (PRENATAL VITAMIN PO) Take by mouth.       Review of Systems  Pertinent pos/neg as indicated in HPI  Blood pressure 126/78, pulse 98, temperature 97.9 F (36.6 C), temperature source Oral, resp. rate 16, height 5\' 9"  (1.753 m), weight (!) 137.3 kg, last menstrual period 06/22/2021. General appearance: alert, cooperative, and no distress Lungs: clear to  auscultation bilaterally Heart: regular rate and rhythm Abdomen: gravid, soft, non-tender Extremities: tr edema DTR's 2+  Fetal monitoring: FHR: 140 bpm, variability: moderate,  Accelerations: Present,  decelerations:  Absent Uterine activity: uterine irritability Dilation: 1 Effacement (%): Thick Station: -3 Exam by:: [email or web address], RN Presentation: cephalic Cervical foley bulb inserted and inflated w/ 11ml LR w/o difficulty    Prenatal labs: ABO, Rh: --/--/O POS (06/21 12-03-1996) Antibody: NEG (06/21 0659) Rubella: 1.69 (01/18 1523) RPR: Non Reactive (04/18 0838)  HBsAg: Negative (01/18 1523)  HIV: Non Reactive (04/18 03-23-1997)  GBS: Positive/-- (06/14 1345)  2hr GTT: normal  Prenatal Transfer Tool  Maternal Diabetes: No Genetic Screening: Normal Maternal Ultrasounds/Referrals: Normal Fetal Ultrasounds or other Referrals:  None Maternal Substance Abuse:  No Significant Maternal Medications:  None Significant Maternal Lab Results: Group B Strep positive  Results for orders placed or performed during the hospital encounter of 02/25/22 (from the past 24 hour(s))  CBC   Collection Time: 02/25/22  6:59 AM  Result Value Ref Range   WBC 7.8 4.0 - 10.5 K/uL   RBC 3.31 (L) 3.87 - 5.11 MIL/uL   Hemoglobin 10.9 (L) 12.0 - 15.0 g/dL   HCT 02/27/22 (L) 46.8 - 03.2 %   MCV 93.7 80.0 - 100.0 fL   MCH 32.9 26.0 - 34.0 pg   MCHC 35.2 30.0 - 36.0 g/dL   RDW 12.2 48.2 - 50.0 %   Platelets 173 150 - 400 K/uL   nRBC 0.0 0.0 - 0.2 %  Protein / creatinine ratio, urine   Collection Time: 02/25/22  6:59 AM  Result Value Ref Range   Creatinine, Urine 109.41 mg/dL   Total Protein, Urine 20 mg/dL   Protein Creatinine Ratio 0.18 (H) 0.00 - 0.15 mg/mg[Cre]  Comprehensive metabolic panel   Collection Time: 02/25/22  6:59 AM  Result Value Ref Range   Sodium 136 135 - 145 mmol/L   Potassium 3.7 3.5 - 5.1 mmol/L   Chloride 110 98 - 111 mmol/L   CO2 19 (L) 22 - 32 mmol/L   Glucose, Bld 96 70 - 99 mg/dL    BUN 7 6 - 20 mg/dL   Creatinine, Ser 02/27/22 0.44 - 1.00 mg/dL   Calcium 9.4 8.9 - 4.88 mg/dL   Total Protein 6.0 (L) 6.5 - 8.1 g/dL   Albumin 2.7 (L) 3.5 - 5.0 g/dL   AST 31 15 - 41 U/L  ALT 51 (H) 0 - 44 U/L   Alkaline Phosphatase 133 (H) 38 - 126 U/L   Total Bilirubin 0.7 0.3 - 1.2 mg/dL   GFR, Estimated >40 >98 mL/min   Anion gap 7 5 - 15  Type and screen   Collection Time: 02/25/22  6:59 AM  Result Value Ref Range   ABO/RH(D) O POS    Antibody Screen NEG    Sample Expiration      02/28/2022,2359 Performed at [facility], [address], [place], [place] [address]      Assessment:  [redacted]w[redacted]d [facility]  G2P1001  IOL for GHTN  Cat 1 FHR  GBS Positive/-- (06/14 1345)  Plan:  Admit to L&D  IV pain meds/epidural prn active labor  Cytotec, foley bulb placed  Anticipate NSVB   Plans to bottlefeed  Contraception: Nexplanon at [facility]  Circumcision: yes  Cheral Marker CNM, WHNP-BC 02/25/2022, 8:57 AM

## 2022-02-25 NOTE — Progress Notes (Signed)
Valerie Rasmussen is a 31 y.o. G2P1001 at [redacted]w[redacted]d by ultrasound admitted for induction of labor due to [facility].  Subjective: Cervical balloon out @ 1115  Objective: BP 135/73   Pulse (!) 104   Temp 97.7 F (36.5 C) (Oral)   Resp 16   Ht 5\' 9"  (1.753 m)   Wt (!) 137.3 kg   LMP 06/22/2021 (Approximate)   BMI 44.70 kg/m  No intake/output data recorded. No intake/output data recorded.  FHT:  FHR: 135 bpm, variability: moderate,  accelerations:  Present,  decelerations:  Absent UC:   regular, every 3-5 minutes SVE:   Dilation: 5 Effacement (%): 50 Station: -3 Exam by:: [email or web address], RN  Labs: Lab Results  Component Value Date   WBC 7.8 02/25/2022   HGB 10.9 (L) 02/25/2022   HCT 31.0 (L) 02/25/2022   MCV 93.7 02/25/2022   PLT 173 02/25/2022    Assessment / Plan: Induction of labor due to gestational hypertension,  progressing well on pitocin  Labor: Progressing on Pitocin, will continue to increase then AROM Preeclampsia:  labs stable Fetal Wellbeing:  Category I Pain Control:  Labor support without medications I/D:   GBS Pos Anticipated MOD:  NSVD  02/27/2022, CNM 02/25/2022, 12:09 PM

## 2022-02-26 ENCOUNTER — Encounter (HOSPITAL_COMMUNITY): Payer: Self-pay | Admitting: Obstetrics and Gynecology

## 2022-02-26 DIAGNOSIS — O99820 Streptococcus B carrier state complicating pregnancy: Secondary | ICD-10-CM

## 2022-02-26 DIAGNOSIS — O134 Gestational [pregnancy-induced] hypertension without significant proteinuria, complicating childbirth: Secondary | ICD-10-CM

## 2022-02-26 DIAGNOSIS — Z3A37 37 weeks gestation of pregnancy: Secondary | ICD-10-CM

## 2022-02-26 LAB — CBC
HCT: 29.5 % — ABNORMAL LOW (ref 36.0–46.0)
Hemoglobin: 10.6 g/dL — ABNORMAL LOW (ref 12.0–15.0)
MCH: 33.4 pg (ref 26.0–34.0)
MCHC: 35.9 g/dL (ref 30.0–36.0)
MCV: 93.1 fL (ref 80.0–100.0)
Platelets: 179 10*3/uL (ref 150–400)
RBC: 3.17 MIL/uL — ABNORMAL LOW (ref 3.87–5.11)
RDW: 13.9 % (ref 11.5–15.5)
WBC: 17.7 10*3/uL — ABNORMAL HIGH (ref 4.0–10.5)
nRBC: 0 % (ref 0.0–0.2)

## 2022-02-26 MED ORDER — DIPHENHYDRAMINE HCL 25 MG PO CAPS: 25.0000 mg | ORAL_CAPSULE | Freq: Four times a day (QID) | ORAL | Status: DC | PRN

## 2022-02-26 MED ORDER — ONDANSETRON HCL 4 MG PO TABS: 4.0000 mg | ORAL_TABLET | ORAL | Status: DC | PRN

## 2022-02-26 MED ORDER — ZOLPIDEM TARTRATE 5 MG PO TABS: 5.0000 mg | ORAL_TABLET | Freq: Every evening | ORAL | Status: DC | PRN

## 2022-02-26 MED ORDER — PRENATAL MULTIVITAMIN CH
1.0000 | ORAL_TABLET | Freq: Every day | ORAL | Status: DC
  Administered 2022-02-26 – 2022-02-27 (×2): 1 via ORAL
  Filled 2022-02-26 (×2): qty 1

## 2022-02-26 MED ORDER — BENZOCAINE-MENTHOL 20-0.5 % EX AERO: 1.0000 | INHALATION_SPRAY | CUTANEOUS | Status: DC | PRN

## 2022-02-26 MED ORDER — SENNOSIDES-DOCUSATE SODIUM 8.6-50 MG PO TABS
2.0000 | ORAL_TABLET | ORAL | Status: DC
  Administered 2022-02-26 – 2022-02-28 (×3): 2 via ORAL
  Filled 2022-02-26 (×3): qty 2

## 2022-02-26 MED ORDER — LACTATED RINGERS BOLUS PEDS: 500.0000 mL | Freq: Once | INTRAVENOUS | Status: DC

## 2022-02-26 MED ORDER — TETANUS-DIPHTH-ACELL PERTUSSIS 5-2.5-18.5 LF-MCG/0.5 IM SUSY: 0.5000 mL | PREFILLED_SYRINGE | Freq: Once | INTRAMUSCULAR | Status: DC

## 2022-02-26 MED ORDER — DIBUCAINE (PERIANAL) 1 % EX OINT: 1.0000 | TOPICAL_OINTMENT | CUTANEOUS | Status: DC | PRN

## 2022-02-26 MED ORDER — COCONUT OIL OIL: 1.0000 | TOPICAL_OIL | Status: DC | PRN

## 2022-02-26 MED ORDER — ACETAMINOPHEN 325 MG PO TABS
650.0000 mg | ORAL_TABLET | ORAL | Status: DC | PRN
  Administered 2022-02-26: 650 mg via ORAL
  Filled 2022-02-26 (×2): qty 2

## 2022-02-26 MED ORDER — ONDANSETRON HCL 4 MG/2ML IJ SOLN: 4.0000 mg | INTRAMUSCULAR | Status: DC | PRN

## 2022-02-26 MED ORDER — SIMETHICONE 80 MG PO CHEW: 80.0000 mg | CHEWABLE_TABLET | ORAL | Status: DC | PRN

## 2022-02-26 MED ORDER — WITCH HAZEL-GLYCERIN EX PADS: 1.0000 | MEDICATED_PAD | CUTANEOUS | Status: DC | PRN

## 2022-02-26 MED ORDER — IBUPROFEN 600 MG PO TABS
600.0000 mg | ORAL_TABLET | Freq: Four times a day (QID) | ORAL | Status: DC
  Administered 2022-02-26 – 2022-02-28 (×8): 600 mg via ORAL
  Filled 2022-02-26 (×8): qty 1

## 2022-02-26 NOTE — Progress Notes (Signed)
Patient ID: Valerie Rasmussen, female   DOB: 25-Nov-1990, 32 y.o.   MRN: [id number] Feeling pressure Vitals:   02/26/22 0200 02/26/22 0231 02/26/22 0303 02/26/22 0340  BP: (!) 122/51 (!) 127/44 (!) 113/54 (!) 153/69  Pulse: (!) 138 (!) 134 (!) 137 (!) 137  Resp: 20 20 20    Temp:  100.1 F (37.8 C)    TempSrc:  Axillary    SpO2: 100%   100%  Weight:      Height:       FHR stable 150s Decels mostly resolved  Cervix almost completely dilated, small anterior lip Vertex at 0 station  Anticipate SVD

## 2022-02-26 NOTE — Progress Notes (Signed)
Patient ID: Valerie Rasmussen, female   DOB: 05-Jul-1991, 31 y.o.   MRN: [id number] Feeling some pressure now  Vitals:   02/26/22 0101 02/26/22 0131 02/26/22 0200 02/26/22 0231  BP: (!) 131/44 (!) 117/53 (!) 122/51 (!) 127/44  Pulse: (!) 139 (!) 141 (!) 138 (!) 134  Resp: 18 18 20    Temp:      TempSrc:      SpO2:   100%   Weight:      Height:       FHR improved Decels resolved Variability good Baseline tachycardia   Dilation: 8 Effacement (%): 100 Cervical Position: Anterior Station: -1, 0 Presentation: Vertex Exam by:: Ileanna Gemmill CNM  Anticipate SVD

## 2022-02-26 NOTE — Discharge Summary (Shared)
Postpartum Discharge Summary  Date of Service updated***     Patient Name: Valerie Rasmussen DOB: 02-Jan-1991 MRN: [id number]  Date of admission: 02/25/2022 Delivery date:02/26/2022  Delivering provider: Seabron Spates  Date of discharge: 02/26/2022  Admitting diagnosis: Gestational hypertension [O13.9] Intrauterine pregnancy: [redacted]w[redacted]d    Secondary diagnosis:  Principal Problem:   Gestational hypertension Active Problems:   Vaginal delivery  Additional problems: nuchal cord x 2    Discharge diagnosis: Term Pregnancy Delivered and Gestational Hypertension                                              Post partum procedures:{Postpartum procedures:23558} Augmentation: AROM, Pitocin, Cytotec, and IP Foley Complications: None  Hospital course: Induction of Labor With Vaginal Delivery   31y.o. yo G2P1001 at 344w1das admitted to the hospital 02/25/2022 for induction of labor.  Indication for induction: Gestational hypertension.  Patient had an uncomplicated labor course as follows: Membrane Rupture Time/Date: 3:27 PM ,02/25/2022   Delivery Method:Vaginal, Spontaneous  Episiotomy: None  Lacerations:  None  Details of delivery can be found in separate delivery note.  Patient had a routine postpartum course. Patient is discharged home 02/26/22.  Newborn Data: Birth date:02/26/2022  Birth time:4:56 AM  Gender:Female  Living status:  Apgars: ,  Weight:   Magnesium Sulfate received: No BMZ received: No Rhophylac:N/A MMR:N/A T-DaP:{Tdap:23962} Flu: {F{VXB:93903}ransfusion:{Transfusion received:30440034}  Physical exam  Vitals:   02/26/22 0416 02/26/22 0431 02/26/22 0500 02/26/22 0515  BP: (!) 161/73 (!) 164/80 (!) 150/85 135/76  Pulse: (!) 142 (!) 138 (!) 124 (!) 117  Resp:      Temp:  100 F (37.8 C)    TempSrc:  Axillary    SpO2: 100%  100%   Weight:      Height:       General: {Exam; general:21111117} Lochia: {Desc;  appropriate/inappropriate:30686::"appropriate"} Uterine Fundus: {Desc; firm/soft:30687} Incision: {Exam; incision:21111123} DVT Evaluation: {Exam; dvESP:2330076}abs: Lab Results  Component Value Date   WBC 15.1 (H) 02/25/2022   HGB 12.1 02/25/2022   HCT 33.5 (L) 02/25/2022   MCV 91.8 02/25/2022   PLT 159 02/25/2022      Latest Ref Rng & Units 02/25/2022    6:59 AM  CMP  Glucose 70 - 99 mg/dL 96   BUN 6 - 20 mg/dL 7   Creatinine 0.44 - 1.00 mg/dL 0.57   Sodium 135 - 145 mmol/L 136   Potassium 3.5 - 5.1 mmol/L 3.7   Chloride 98 - 111 mmol/L 110   CO2 22 - 32 mmol/L 19   Calcium 8.9 - 10.3 mg/dL 9.4   Total Protein 6.5 - 8.1 g/dL 6.0   Total Bilirubin 0.3 - 1.2 mg/dL 0.7   Alkaline Phos 38 - 126 U/L 133   AST 15 - 41 U/L 31   ALT 0 - 44 U/L 51    Edinburgh Score:     No data to display            After visit meds:  Allergies as of 02/26/2022   No Known Allergies   [facility]        Discharge home in stable condition Infant Feeding: Breast Infant Disposition:home with mother Discharge instruction: per After Visit Summary and Postpartum booklet. Activity: Advance as tolerated. Pelvic rest for 6  weeks.  Diet: routine diet Anticipated Birth Control: Nexplanon Postpartum Appointment:1 week Additional Postpartum F/U: BP check 1 week Future Appointments: Future Appointments  Date Time Provider [place]  03/09/2022 11:10 AM [facility] NURSE [facility]   Follow up Visit:      02/26/2022 Hansel Feinstein, CNM

## 2022-02-26 NOTE — Anesthesia Postprocedure Evaluation (Signed)
Anesthesia Post Note  Patient: SHONITA RINCK  Procedure(s) Performed: AN AD HOC LABOR EPIDURAL     Patient location during evaluation: Mother Baby Anesthesia Type: Epidural Level of consciousness: awake and alert and oriented Pain management: satisfactory to patient Vital Signs Assessment: post-procedure vital signs reviewed and stable Respiratory status: respiratory function stable Cardiovascular status: stable Postop Assessment: no headache, no backache, epidural receding, patient able to bend at knees, no signs of nausea or vomiting, adequate PO intake and able to ambulate Anesthetic complications: no   No notable events documented.  Last Vitals:  Vitals:   02/26/22 1320 02/26/22 1420  BP: 127/87 119/61  Pulse: 87 100  Resp: 18   Temp: 36.5 C   SpO2: 100%     Last Pain:  Vitals:   02/26/22 1320  TempSrc: Oral  PainSc: 2    Pain Goal:                   Karesa Maultsby

## 2022-02-27 MED ORDER — FUROSEMIDE 20 MG PO TABS
20.0000 mg | ORAL_TABLET | Freq: Every day | ORAL | Status: DC
  Administered 2022-02-27 – 2022-02-28 (×2): 20 mg via ORAL
  Filled 2022-02-27 (×2): qty 1

## 2022-02-28 MED ORDER — IBUPROFEN 600 MG PO TABS: 600.0000 mg | ORAL_TABLET | Freq: Four times a day (QID) | ORAL | 0 refills | Status: DC

## 2022-02-28 MED ORDER — FUROSEMIDE 20 MG PO TABS: 20.0000 mg | ORAL_TABLET | Freq: Every day | ORAL | 0 refills | Status: DC

## 2022-03-04 ENCOUNTER — Ambulatory Visit (INDEPENDENT_AMBULATORY_CARE_PROVIDER_SITE_OTHER): Payer: BC Managed Care – PPO | Admitting: *Deleted

## 2022-03-04 ENCOUNTER — Other Ambulatory Visit: Payer: Self-pay | Admitting: Advanced Practice Midwife

## 2022-03-04 VITALS — BP 135/89 | HR 86

## 2022-03-04 DIAGNOSIS — Z8759 Personal history of other complications of pregnancy, childbirth and the puerperium: Secondary | ICD-10-CM

## 2022-03-04 DIAGNOSIS — Z0130 Encounter for examination of blood pressure without abnormal findings: Secondary | ICD-10-CM

## 2022-03-04 MED ORDER — NIFEDIPINE ER OSMOTIC RELEASE 30 MG PO TB24: 30.0000 mg | ORAL_TABLET | Freq: Every day | ORAL | 2 refills | Status: AC

## 2022-03-04 NOTE — Progress Notes (Signed)
   NURSE VISIT- BLOOD PRESSURE CHECK  SUBJECTIVE:  Valerie Rasmussen is a 31 y.o. G78P2002 female here for BP check. She is postpartum, delivery date 02/26/22     HYPERTENSION ROS:  Postpartum:  Severe headaches that don't go away with tylenol/other medicines: No  Visual changes (seeing spots/double/blurred vision) No  Severe pain under right breast breast or in center of upper chest No  Severe nausea/vomiting No  Taking medicines as instructed not applicable  OBJECTIVE:  BP 135/89 (BP Location: Right Arm, Patient Position: Sitting, Cuff Size: Large)   Pulse 86   Breastfeeding No   Appearance alert, well appearing, and in no distress and oriented to person, place, and time.  ASSESSMENT: Postpartum  blood pressure check  PLAN: Discussed with Philipp Deputy, CNM   Recommendations: new prescription will be sent, stop medication 2 days prior to [facility] visit Follow-up: as scheduled   Jobe Marker  03/04/2022 11:34 AM

## 2022-03-09 ENCOUNTER — Encounter: Payer: BC Managed Care – PPO | Admitting: Women's Health

## 2022-04-01 ENCOUNTER — Ambulatory Visit (INDEPENDENT_AMBULATORY_CARE_PROVIDER_SITE_OTHER): Payer: BC Managed Care – PPO | Admitting: Advanced Practice Midwife

## 2022-04-01 ENCOUNTER — Encounter: Payer: Self-pay | Admitting: Advanced Practice Midwife

## 2022-04-01 DIAGNOSIS — Z8759 Personal history of other complications of pregnancy, childbirth and the puerperium: Secondary | ICD-10-CM

## 2022-04-01 NOTE — Progress Notes (Signed)
POSTPARTUM VISIT Patient name: Valerie Rasmussen MRN [id number]  Date of birth: 07-09-1991 Chief Complaint:   Postpartum Care  History of Present Illness:   Valerie Rasmussen is a 31 y.o. G69P2002 African American female being seen today for a postpartum visit. She is 4 weeks postpartum following a spontaneous vaginal delivery at 37.1 gestational weeks. IOL: yes, for gestational hypertension . Anesthesia: epidural.  Laceration: none.  Complications: none. Inpatient contraception: no.   Pregnancy complicated by gHTN . Tobacco use: former . Substance use disorder: yes Marijuana previously . Last pap smear: Sept 2022 and results were NILM w/ HRHPV negative. Next pap smear due: Sept 2025 No LMP recorded.  Postpartum course has been complicated by being started on ProcardiaXL 94m on PPD#6 during RN BP check visit; stopped meds x 2d ago . Bleeding none. Bowel function is normal. Bladder function is normal. Urinary incontinence? no, fecal incontinence? no Patient is sexually active. Last sexual activity:  10d ago . Desired contraception: Nexplanon. Patient does not know about a pregnancy in the future.  Desired family size is unsure number of children.   Upstream - 04/01/22 1136       Pregnancy Intention Screening   Does the patient want to become pregnant in the next year? No    Does the patient's partner want to become pregnant in the next year? No    Would the patient like to discuss contraceptive options today? Yes      Contraception Wrap Up   Current Method No Contraceptive Precautions    End Method No Contraception Precautions    Contraception Counseling Provided Yes            The pregnancy intention screening data noted above was reviewed. Potential methods of contraception were discussed. The patient elected to proceed with No Contraception Precautions.  Edinburgh Postpartum Depression Screening: negative  Edinburgh Postnatal Depression Scale - 04/01/22 1135        Edinburgh Postnatal Depression Scale:  In the Past 7 Days   I have been able to laugh and see the funny side of things. 0    I have looked forward with enjoyment to things. 0    I have blamed myself unnecessarily when things went wrong. 1    I have been anxious or worried for no good reason. 0    I have felt scared or panicky for no good reason. 0    Things have been getting on top of me. 0    I have been so unhappy that I have had difficulty sleeping. 0    I have felt sad or miserable. 0    I have been so unhappy that I have been crying. 0                12/23/2021    9:13 AM 09/24/2021   10:36 AM 05/23/2021   10:50 AM  GAD 7 : Generalized Anxiety Score  Nervous, Anxious, on Edge 0 0 0  Control/stop worrying 0 0 0  Worry too much - different things 0 1 0  Trouble relaxing 0 0 0  Restless 0 0 0  Easily annoyed or irritable 0 0 1  Afraid - awful might happen 0 0 0  Total GAD 7 Score 0 1 1     Baby's course has been uncomplicated. Baby is feeding by bottle. Infant has a pediatrician/family doctor? Yes.  Childcare strategy if returning to work/school: family.  Pt has material needs met for her and baby:  Yes.   Review of Systems:   Pertinent items are noted in HPI Denies Abnormal vaginal discharge w/ itching/odor/irritation, headaches, visual changes, shortness of breath, chest pain, abdominal pain, severe nausea/vomiting, or problems with urination or bowel movements. Pertinent History Reviewed:  Reviewed past medical,surgical, obstetrical and family history.  Reviewed problem list, medications and allergies. OB History  Gravida Para Term Preterm AB Living  _0 0 0 2  SAB IAB Ectopic Multiple Live Births  0 0 0 0 2    # Outcome Date GA Lbr Len/2nd Weight Sex Delivery Anes PTL Lv  2 Term 02/26/22 40w1d13:28 / 00:01 6 lb 9.1 oz (2.98 kg) M Vag-Spont EPI  LIV  1 Term 03/18/13 329w4d 00:07 5 lb 8 oz (2.495 kg) M Vag-Spont EPI N LIV     Complications: Gestational  hypertension   Physical Assessment:   Vitals:   04/01/22 1118  BP: (!) 156/90  Pulse: 91  Weight: 291 lb (132 kg)  Height: 5' 7" (1.702 m)  Body mass index is 45.58 kg/m.       Physical Examination:   General appearance: alert, well appearing, and in no distress  Mental status: alert, oriented to person, place, and time  Skin: warm & dry   Cardiovascular: normal heart rate noted   Respiratory: normal respiratory effort, no distress   Breasts: deferred, no complaints   Abdomen: soft, non-tender   Pelvic: examination not indicated. Thin prep pap obtained: No  Rectal: not examined  Extremities: Edema: Trace         No results found for this or any previous visit (from the past 24 hour(s)).  Assessment & Plan:  1) Postpartum exam 2) Four wks s/p spontaneous vaginal delivery 3) bottle feeding 4) Depression screening- neg 5) Contraception counseling- no sex until Nexplanon placed; will schedule for ~4d; has had Nexplanon in the past x 2 and did well with it 6) gHTN- started on ProcardiaXL 3034mn PPD#6, stopped as instructed 2d ago; values still elevated; will restart and have RN BP check in 4wks  Essential components of care per ACOG recommendations:  1.  Mood and well being:  If positive depression screen, discussed and plan developed.  If using tobacco we discussed reduction/cessation and risk of relapse If current substance abuse, we discussed and referral to local resources was offered.   2. Infant care and feeding:  If breastfeeding, discussed returning to work, pumping, breastfeeding-associated pain, guidance regarding return to fertility while lactating if not using another method. If needed, patient was provided with a letter to be allowed to pump q 2-3hrs to support lactation in a private location with access to a refrigerator to store breastmilk.   Recommended that all caregivers be immunized for flu, pertussis and other preventable communicable diseases If pt does  not have material needs met for her/baby, referred to local resources for help obtaining these.  3. Sexuality, contraception and birth spacing Provided guidance regarding sexuality, management of dyspareunia, and resumption of intercourse Discussed avoiding interpregnancy interval <6mt89mand recommended birth spacing of 18 months  4. Sleep and fatigue Discussed coping options for fatigue and sleep disruption Encouraged family/partner/community support of 4 hrs of uninterrupted sleep to help with mood and fatigue  5. Physical recovery  If pt had a C/S, assessed incisional pain and providing guidance on normal vs prolonged recovery If pt had a laceration, perineal healing and pain reviewed.  If urinary or fecal incontinence, discussed management and referred to PT or  uro/gyn if indicated  Patient is safe to resume physical activity (no sex until Nexplanon placed). Discussed attainment of healthy weight.  6.  Chronic disease management Discussed pregnancy complications if any, and their implications for future childbearing and long-term maternal health. Review recommendations for prevention of recurrent pregnancy complications, such as 17 hydroxyprogesterone caproate to reduce risk for recurrent PTB not applicable, or aspirin to reduce risk of preeclampsia yes. Pt had GDM: no. If yes, 2hr GTT scheduled: not applicable. Reviewed medications and non-pregnant dosing including consideration of whether pt is breastfeeding using a reliable resource such as LactMed: not applicable Referred for f/u w/ PCP or subspecialist providers as indicated: not applicable  7. Health maintenance Mammogram at 31yo or earlier if indicated Pap smears as indicated  Meds: No orders of the defined types were placed in this encounter.   Follow-up: Return for Nexplanon insertion Monday; RN BP check in 4 weeks.   No orders of the defined types were placed in this encounter.   Myrtis Ser CNM 04/01/2022 11:42  AM

## 2022-04-13 ENCOUNTER — Encounter: Payer: Self-pay | Admitting: Women's Health

## 2022-04-13 ENCOUNTER — Ambulatory Visit (INDEPENDENT_AMBULATORY_CARE_PROVIDER_SITE_OTHER): Payer: BC Managed Care – PPO | Admitting: Women's Health

## 2022-04-13 VITALS — BP 133/89 | HR 75 | Ht 67.0 in | Wt 295.0 lb

## 2022-04-13 DIAGNOSIS — Z3202 Encounter for pregnancy test, result negative: Secondary | ICD-10-CM | POA: Diagnosis not present

## 2022-04-13 DIAGNOSIS — Z30017 Encounter for initial prescription of implantable subdermal contraceptive: Secondary | ICD-10-CM | POA: Insufficient documentation

## 2022-04-13 DIAGNOSIS — O165 Unspecified maternal hypertension, complicating the puerperium: Secondary | ICD-10-CM

## 2022-04-13 MED ORDER — ETONOGESTREL 68 MG ~~LOC~~ IMPL
68.0000 mg | DRUG_IMPLANT | Freq: Once | SUBCUTANEOUS | Status: AC
  Administered 2022-04-13: 68 mg via SUBCUTANEOUS

## 2022-04-13 NOTE — Patient Instructions (Addendum)
Stop nifedipine on 8/21  Keep the area clean and dry.  You can remove the big bandage in 24 hours, and the small steri-strip bandage in 3-5 days.  A back up method, such as condoms, should be used for two weeks. You may have irregular vaginal bleeding for the first 6 months after the Nexplanon is placed, then the bleeding usually lightens and it is possible that you may not have any periods.  If you have any concerns, please give [place] a call.

## 2022-04-13 NOTE — Addendum Note (Signed)
Addended by: Moss Mc on: 04/13/2022 12:16 PM   Modules accepted: Orders

## 2022-04-13 NOTE — Progress Notes (Signed)
   NEXPLANON INSERTION Patient name: Valerie Rasmussen MRN [id number]  Date of birth: 21-Nov-1990 Subjective Findings:   Valerie Rasmussen is a 31 y.o. G57P2002 African American female being seen today for insertion of a Nexplanon. Was put back on nifedipine on 7/26 for continued elevated bp. Needs note to return to work Monday 8/14-given.  Patient's last menstrual period was 04/11/2022. Last sexual intercourse was 7/16 Last pap9/16/22. Results were: NILM w/ HRHPV negative  Risks/benefits/side effects of Nexplanon have been discussed and her questions have been answered.  Specifically, a failure rate of 09/998 has been reported, with an increased failure rate if pt takes St. John's Wort and/or antiseizure medicaitons.  She is aware of the common side effect of irregular bleeding, which the incidence of decreases over time. Signed copy of informed consent in chart.      12/23/2021    9:13 AM 09/24/2021   10:35 AM 05/23/2021   10:50 AM  Depression screen PHQ 2/9  Decreased Interest 1 0 1  Down, Depressed, Hopeless 0 0 0  PHQ - 2 Score 1 0 1  Altered sleeping 0 0 0  Tired, decreased energy 1 1 1   Change in appetite 0 0 0  Feeling bad or failure about yourself  0 0 0  Trouble concentrating 0 0 0  Moving slowly or fidgety/restless 0 0 0  Suicidal thoughts 0 0 0  PHQ-9 Score 2 1 2         12/23/2021    9:13 AM 09/24/2021   10:36 AM 05/23/2021   10:50 AM  GAD 7 : Generalized Anxiety Score  Nervous, Anxious, on Edge 0 0 0  Control/stop worrying 0 0 0  Worry too much - different things 0 1 0  Trouble relaxing 0 0 0  Restless 0 0 0  Easily annoyed or irritable 0 0 1  Afraid - awful might happen 0 0 0  Total GAD 7 Score 0 1 1     Pertinent History Reviewed:   Reviewed past medical,surgical, social, obstetrical and family history.  Reviewed problem list, medications and allergies. Objective Findings & Procedure:    Vitals:   04/13/22 1047  BP: 133/89  Pulse: 75  Weight: 295 lb  (133.8 kg)  Height: 5\' 7"  (1.702 m)  Body mass index is 46.2 kg/m.  No results found for this or any previous visit (from the past 24 hour(s)).   Time out was performed.  She is right-handed, so her left arm, approximately 10cm from the medial epicondyle and 3-5cm posterior to the sulcus, was cleansed with alcohol and anesthetized with 2cc of 2% Lidocaine.  The area was cleansed again with betadine and the Nexplanon was inserted per manufacturer's recommendations without difficulty.  3 steri-strips and pressure bandage were applied. The patient tolerated the procedure well.  Assessment & Plan:   1) Nexplanon insertion Pt was instructed to keep the area clean and dry, remove pressure bandage in 24 hours, and keep insertion site covered with the steri-strip for 3-5 days.  Back up contraception was recommended for 2 weeks.  She was given a card indicating date Nexplanon was inserted and date it needs to be removed. Follow-up PRN problems.  2) PPHTN> stop nifedipine 8/21 (2d before nurse bp check)  Orders Placed This Encounter  Procedures   POCT urine pregnancy    Follow-up: Return for As scheduled.  06/13/22 CNM, [facility] 04/13/2022 11:21 AM

## 2022-04-29 ENCOUNTER — Ambulatory Visit: Payer: BC Managed Care – PPO

## 2022-05-28 ENCOUNTER — Encounter: Payer: Self-pay | Admitting: Emergency Medicine

## 2022-05-28 ENCOUNTER — Ambulatory Visit
Admission: EM | Admit: 2022-05-28 | Discharge: 2022-05-28 | Disposition: A | Discharge status: Home / Self Care | Payer: BC Managed Care – PPO | Attending: Nurse Practitioner | Admitting: Nurse Practitioner

## 2022-05-28 DIAGNOSIS — M5441 Lumbago with sciatica, right side: Secondary | ICD-10-CM

## 2022-05-28 MED ORDER — CYCLOBENZAPRINE HCL 5 MG PO TABS: 5.0000 mg | ORAL_TABLET | Freq: Every evening | ORAL | 0 refills | Status: DC | PRN

## 2022-05-28 MED ORDER — KETOROLAC TROMETHAMINE 30 MG/ML IJ SOLN
30.0000 mg | Freq: Once | INTRAMUSCULAR | Status: AC
  Administered 2022-05-28: 30 mg via INTRAMUSCULAR

## 2022-05-28 MED ORDER — IBUPROFEN 800 MG PO TABS: 800.0000 mg | ORAL_TABLET | Freq: Three times a day (TID) | ORAL | 0 refills | Status: DC

## 2022-05-28 NOTE — ED Triage Notes (Signed)
Lower back pain x 2 days.  States she was moving a pallet at work and thinks she may have pulled something in her back.

## 2022-05-28 NOTE — ED Provider Notes (Signed)
[facility]    CSN: [id number] Arrival date & time: 05/28/22  2694      History   Chief Complaint No chief complaint on file.   HPI Valerie Rasmussen is a 31 y.o. female.   Patient presents for right lower back pain that began a few days ago.  Denies recent accident, fall, or trauma to the back.  Reports recently at work, she lifted a heavy object and felt the pain shortly after.  Reports the pain is severe and sharp.  The pain radiates down her right leg to her toes.  She took a muscle relaxant a few days ago that made her fall asleep and helped with the pain minimally.  Has not taken anything or tried heat/ice since then.  Denies weakness or decreased sensation.  Reports she has been having to walk with a cane because the pain "catches her."   She denies any numbness or tingling down her leg, saddle anesthesia, new bowel or bladder incontinence, fever, nausea/vomiting, dysuria/urinary frequency, hematuria since the back pain started.    Past Medical History:  Diagnosis Date   Pregnancy induced hypertension    Pregnant     Patient Active Problem List   Diagnosis Date Noted   Nexplanon insertion 04/13/2022   History of gestational hypertension 09/22/2021   Left breast mass 01/10/2013    Past Surgical History:  Procedure Laterality Date   NO PAST SURGERIES      OB History     Gravida  2   Para  2   Term  2   Preterm  0   AB  0   Living  2      SAB  0   IAB  0   Ectopic  0   Multiple  0   Live Births  2            Home Medications    Prior to Admission medications   Medication Sig Start Date End Date Taking? Authorizing Provider  cyclobenzaprine (FLEXERIL) 5 MG tablet Take 1 tablet (5 mg total) by mouth at bedtime as needed for muscle spasms. Do not take with alcohol or while driving or operating heavy machinery 05/28/22  Yes Cathlean Marseilles A, NP  ibuprofen (ADVIL) 800 MG tablet Take 1 tablet (800 mg total) by mouth 3 (three)  times daily. Take with food to prevent GI upset 05/28/22  Yes Cathlean Marseilles A, NP  acetaminophen (TYLENOL) 500 MG tablet Take 500 mg by mouth every 6 (six) hours as needed. Patient not taking: Reported on 03/04/2022    [provider]  NIFEdipine (PROCARDIA XL) 30 MG 24 hr tablet Take 1 tablet (30 mg total) by mouth daily. 03/04/22   Arabella Merles, CNM    Family History Family History  Problem Relation Age of Onset   Glaucoma Mother    Heart attack Father    Cancer Sister    Heart Problems Brother    Cancer Maternal Aunt        breast   Diabetes Maternal Uncle    Heart attack Paternal Uncle    Stroke Maternal Grandmother    Hypertension Other    Coronary artery disease Other    Cancer Other    Heart disease Other     Social History Social History   Tobacco Use   Smoking status: Former    Types: Cigarettes   Smokeless tobacco: Never   Tobacco comments:    drinks with alcohol  Vaping  Use   Vaping Use: Never used  Substance Use Topics   Alcohol use: Not Currently    Comment: occ   Drug use: No    Types: Marijuana    Comment: not now     Allergies   Patient has no known allergies.   Review of Systems Review of Systems Per HPI  Physical Exam Triage Vital Signs ED Triage Vitals  Enc Vitals Group     BP 05/28/22 0921 (!) 160/93     Pulse Rate 05/28/22 0921 85     Resp 05/28/22 0921 18     Temp 05/28/22 0921 97.9 F (36.6 C)     Temp Source 05/28/22 0921 Oral     SpO2 05/28/22 0921 98 %     Weight --      Height --      Head Circumference --      Peak Flow --      Pain Score 05/28/22 0923 9     Pain Loc --      Pain Edu? --      Excl. in GC? --    No data found.  Updated Vital Signs BP (!) 160/93 (BP Location: Right Arm)   Pulse 85   Temp 97.9 F (36.6 C) (Oral)   Resp 18   LMP 05/04/2022 (Approximate)   SpO2 98%   Breastfeeding No   Visual Acuity Right Eye Distance:   Left Eye Distance:   Bilateral Distance:    Right Eye  Near:   Left Eye Near:    Bilateral Near:     Physical Exam Vitals and nursing note reviewed.  Constitutional:      General: She is not in acute distress.    Appearance: Normal appearance. She is not toxic-appearing.  HENT:     Mouth/Throat:     Mouth: Mucous membranes are moist.     Pharynx: Oropharynx is clear.  Pulmonary:     Effort: Pulmonary effort is normal. No respiratory distress.  Musculoskeletal:     Lumbar back: Tenderness present. No swelling, edema, deformity, spasms or bony tenderness. Normal range of motion.       Back:     Right lower leg: No edema.     Left lower leg: No edema.     Comments: Inspection: No swelling, obvious deformity, or redness to the right lower back or buttock Palpation: Right low back/buttock tender to palpation in area marked; no obvious deformities palpated ROM: Full ROM to right leg/hip Strength: 5/5 right lower extremity Neurovascular: neurovascularly intact right lower extremity   Skin:    General: Skin is warm and dry.     Capillary Refill: Capillary refill takes less than 2 seconds.     Coloration: Skin is not jaundiced or pale.     Findings: No erythema.  Neurological:     Mental Status: She is alert and oriented to person, place, and time.  Psychiatric:        Behavior: Behavior is cooperative.      UC Treatments / Results  Labs (all labs ordered are listed, but only abnormal results are displayed) Labs Reviewed - No data to display  EKG   Radiology No results found.  Procedures Procedures (including critical care time)  Medications Ordered in UC Medications  ketorolac (TORADOL) 30 MG/ML injection 30 mg (30 mg Intramuscular Given 05/28/22 0943)    Initial Impression / Assessment and Plan / UC Course  I have reviewed the triage vital signs and the  nursing notes.  Pertinent labs & imaging results that were available during my care of the patient were reviewed by me and considered in my medical decision making  (see chart for details).    Patient is well-appearing, afebrile, not tachycardic, not tachypneic, oxygenating well on room air.  She is hypertensive, likely secondary to pain.  Suspect acute low back pain with sciatica secondary to sciatic nerve inflammation from heavy lifting.  Toradol 30 mg IM given in urgent care for pain today.  Recommended use of Tylenol and muscle relaxant today as needed for pain.  Starting tomorrow, can alternate ibuprofen with Tylenol, continue muscle accident nighttime as needed.  Encourage stretching/strengthening exercises to back.  Discussed good body mechanics.  ER precautions and return precautions discussed.  Note given for work.  The patient was given the opportunity to ask questions.  All questions answered to their satisfaction.  The patient is in agreement to this plan.    Final Clinical Impressions(s) / UC Diagnoses   Final diagnoses:  Acute right-sided low back pain with right-sided sciatica     Discharge Instructions      Your back pain is likely secondary to a muscle strain affecting your sciatic nerve.  We have given you a shot of Toradol today to help with pain and inflammation.  Later today, please take Tylenol 500 to 1000 mg every 6 hours as needed for pain.  You can also take the cyclobenzaprine (muscle relaxant) at nighttime as needed for pain.  Starting tomorrow, alternate ibuprofen every 8 hours with Tylenol every 6 hours for pain.  Continue cyclobenzaprine at nighttime as needed.  Start light stretching exercises to your back.  Make sure you use good body mechanics when you lift heavy objects in the future.    ED Prescriptions     Medication Sig Dispense Auth. Provider   ibuprofen (ADVIL) 800 MG tablet Take 1 tablet (800 mg total) by mouth 3 (three) times daily. Take with food to prevent GI upset 21 tablet Noemi Chapel A, NP   cyclobenzaprine (FLEXERIL) 5 MG tablet Take 1 tablet (5 mg total) by mouth at bedtime as needed for muscle  spasms. Do not take with alcohol or while driving or operating heavy machinery 30 tablet Eulogio Bear, NP      PDMP not reviewed this encounter.   Eulogio Bear, NP 05/28/22 1013

## 2022-05-28 NOTE — Discharge Instructions (Signed)
Your back pain is likely secondary to a muscle strain affecting your sciatic nerve.  We have given you a shot of Toradol today to help with pain and inflammation.  Later today, please take Tylenol 500 to 1000 mg every 6 hours as needed for pain.  You can also take the cyclobenzaprine (muscle relaxant) at nighttime as needed for pain.  Starting tomorrow, alternate ibuprofen every 8 hours with Tylenol every 6 hours for pain.  Continue cyclobenzaprine at nighttime as needed.  Start light stretching exercises to your back.  Make sure you use good body mechanics when you lift heavy objects in the future.

## 2022-08-17 ENCOUNTER — Other Ambulatory Visit: Payer: Self-pay

## 2022-08-17 ENCOUNTER — Emergency Department (HOSPITAL_COMMUNITY): Payer: BC Managed Care – PPO

## 2022-08-17 ENCOUNTER — Emergency Department (HOSPITAL_COMMUNITY)
Admission: EM | Admit: 2022-08-17 | Discharge: 2022-08-17 | Disposition: A | Discharge status: Home / Self Care | Payer: BC Managed Care – PPO | Attending: Emergency Medicine | Admitting: Emergency Medicine

## 2022-08-17 ENCOUNTER — Encounter (HOSPITAL_COMMUNITY): Payer: Self-pay

## 2022-08-17 DIAGNOSIS — R0789 Other chest pain: Secondary | ICD-10-CM | POA: Diagnosis not present

## 2022-08-17 DIAGNOSIS — N83201 Unspecified ovarian cyst, right side: Secondary | ICD-10-CM | POA: Diagnosis not present

## 2022-08-17 DIAGNOSIS — R079 Chest pain, unspecified: Secondary | ICD-10-CM | POA: Diagnosis not present

## 2022-08-17 DIAGNOSIS — M5441 Lumbago with sciatica, right side: Secondary | ICD-10-CM | POA: Diagnosis not present

## 2022-08-17 DIAGNOSIS — M5442 Lumbago with sciatica, left side: Secondary | ICD-10-CM | POA: Diagnosis not present

## 2022-08-17 DIAGNOSIS — K5730 Diverticulosis of large intestine without perforation or abscess without bleeding: Secondary | ICD-10-CM | POA: Diagnosis not present

## 2022-08-17 DIAGNOSIS — M549 Dorsalgia, unspecified: Secondary | ICD-10-CM | POA: Diagnosis not present

## 2022-08-17 DIAGNOSIS — I10 Essential (primary) hypertension: Secondary | ICD-10-CM | POA: Diagnosis not present

## 2022-08-17 DIAGNOSIS — G8929 Other chronic pain: Secondary | ICD-10-CM | POA: Diagnosis not present

## 2022-08-17 DIAGNOSIS — R109 Unspecified abdominal pain: Secondary | ICD-10-CM | POA: Diagnosis not present

## 2022-08-17 LAB — URINALYSIS, ROUTINE W REFLEX MICROSCOPIC
Bilirubin Urine: NEGATIVE
Glucose, UA: NEGATIVE mg/dL
Hgb urine dipstick: NEGATIVE
Ketones, ur: NEGATIVE mg/dL
Leukocytes,Ua: NEGATIVE
Nitrite: NEGATIVE
Protein, ur: 30 mg/dL — AB
Specific Gravity, Urine: 1.03 (ref 1.005–1.030)
pH: 7 (ref 5.0–8.0)

## 2022-08-17 LAB — D-DIMER, QUANTITATIVE: D-Dimer, Quant: 0.41 ug/mL-FEU (ref 0.00–0.50)

## 2022-08-17 LAB — POC URINE PREG, ED: Preg Test, Ur: NEGATIVE

## 2022-08-17 LAB — TROPONIN I (HIGH SENSITIVITY)
Troponin I (High Sensitivity): <2 ng/L (ref <18)
Troponin I (High Sensitivity): 2 ng/L (ref <18)

## 2022-08-17 MED ORDER — KETOROLAC TROMETHAMINE 60 MG/2ML IM SOLN
60.0000 mg | Freq: Once | INTRAMUSCULAR | Status: AC
  Administered 2022-08-17: 60 mg via INTRAMUSCULAR
  Filled 2022-08-17: qty 2

## 2022-08-17 MED ORDER — METHOCARBAMOL 500 MG PO TABS: 500.0000 mg | ORAL_TABLET | Freq: Three times a day (TID) | ORAL | 0 refills | Status: DC

## 2022-08-17 MED ORDER — OXYCODONE-ACETAMINOPHEN 5-325 MG PO TABS
1.0000 | ORAL_TABLET | Freq: Once | ORAL | Status: AC
  Administered 2022-08-17: 1 via ORAL
  Filled 2022-08-17: qty 1

## 2022-08-17 MED ORDER — OXYCODONE-ACETAMINOPHEN 5-325 MG PO TABS: 1.0000 | ORAL_TABLET | ORAL | 0 refills | Status: DC | PRN

## 2022-08-17 NOTE — ED Provider Notes (Signed)
Patient care taken over at shift handoff from outgoing provider.  In short, 31 year old patient presenting with worsening low chronic back pain for the past week.  Patient also complained of mild chest pain.  Patient denies nausea, vomiting.  Patient tried Tylenol and a muscle relaxer without improvement. Physical Exam  BP 133/80   Pulse 64   Temp 97.8 F (36.6 C) (Oral)   Resp (!) 23   Ht 5\' 7"  (1.702 m)   Wt 113.4 kg   SpO2 100%   BMI 39.16 kg/m   Physical Exam  Procedures  Procedures  ED Course / MDM    Medical Decision Making Amount and/or Complexity of Data Reviewed Labs: ordered. Radiology: ordered.  Risk Prescription drug management.   I reviewed laboratory results.  Initial troponin less than 2, negative pregnancy test, D-dimer 0.41, unremarkable urinalysis.  I ordered and interpreted imaging including a CT renal stone study Diverticulosis without diverticulitis.    No acute abnormality to correspond with the given clinical history  is noted.    2.6 cm right ovarian simple-appearing cyst. No follow-up imaging is  recommended.  I agree with the radiologist findings  Patient is stable at this time for discharge home.  No signs of ACS or coronary involvement.  No acute findings noted on CT renal stone study.  Patient with likely worsening of chronic low back pain.  Prescriptions sent by previous provider.       08/17/22 2234    2235, MD 08/18/22 [phone]

## 2022-08-17 NOTE — Discharge Instructions (Addendum)
You have been treated this evening for low back pain.  Try to avoid twisting bending or heavy lifting for at least 1 week.  You may apply over-the-counter 4% lidocaine patches as directed to your back.  Take the prescribed medication as directed.  Please follow-up with your primary care provider for recheck

## 2022-08-17 NOTE — ED Notes (Signed)
See triage notes

## 2022-08-17 NOTE — ED Triage Notes (Signed)
Pt presents with acute on chronic back pain that worsened a week ago. Pt locates the pain to the lumbosacral area with radiation down the posterior of bilateral legs. Denies any episodes or urinary or fecal incontinence.

## 2022-08-17 NOTE — ED Notes (Signed)
PA at bedside.

## 2022-08-17 NOTE — ED Notes (Signed)
Pa in with pt.

## 2022-08-17 NOTE — ED Provider Notes (Signed)
[facility] EMERGENCY DEPARTMENT Provider Note   CSN: [id number] Arrival date & time: 08/17/22  1545     History  Chief Complaint  Patient presents with   Back Pain    Valerie Rasmussen is a 31 y.o. female.   Back Pain Associated symptoms: abdominal pain   Associated symptoms: no chest pain, no dysuria, no fever, no numbness, no pelvic pain and no weakness        Valerie Rasmussen is a 31 y.o. female who presents to the Emergency Department complaining of worsening of her chronic low back pain x 1 week.  She describes constant aching pain across her lower back that radiates into both legs to the level of her ankles.  Pain is worse with walking and with sitting.  Improves while lying down.  She denies any recent injury, fever, chills, flank pain, urine or bowel changes, saddle anesthesias numbness or weakness of the lower extremities pain also radiates into mid to left flank area.  No nausea or vomiting.  She has been taking Tylenol and muscle relaxer without improvement. No hx of kidney stones  Home Medications Prior to Admission medications   Medication Sig Start Date End Date Taking? Authorizing Provider  acetaminophen (TYLENOL) 500 MG tablet Take 500 mg by mouth every 6 (six) hours as needed. Patient not taking: Reported on 03/04/2022    [provider]  cyclobenzaprine (FLEXERIL) 5 MG tablet Take 1 tablet (5 mg total) by mouth at bedtime as needed for muscle spasms. Do not take with alcohol or while driving or operating heavy machinery 05/28/22   Valentino Nose, NP  ibuprofen (ADVIL) 800 MG tablet Take 1 tablet (800 mg total) by mouth 3 (three) times daily. Take with food to prevent GI upset 05/28/22   Valentino Nose, NP  NIFEdipine (PROCARDIA XL) 30 MG 24 hr tablet Take 1 tablet (30 mg total) by mouth daily. 03/04/22   Arabella Merles, CNM      Allergies    Patient has no known allergies.    Review of Systems   Review of Systems  Constitutional:   Negative for chills and fever.  Respiratory:  Negative for shortness of breath.   Cardiovascular:  Negative for chest pain and leg swelling.  Gastrointestinal:  Positive for abdominal pain. Negative for nausea and vomiting.  Genitourinary:  Negative for difficulty urinating, dysuria, flank pain, pelvic pain, vaginal bleeding, vaginal discharge and vaginal pain.  Musculoskeletal:  Positive for back pain.  Skin:  Negative for rash and wound.  Neurological:  Negative for weakness and numbness.    Physical Exam Updated Vital Signs BP (!) 136/92 (BP Location: Right Arm)   Pulse 82   Temp 98.2 F (36.8 C) (Oral)   Resp (!) 24   Ht 5\' 7"  (1.702 m)   Wt 113.4 kg   SpO2 100%   BMI 39.16 kg/m  Physical Exam Vitals and nursing note reviewed.  Constitutional:      General: She is not in acute distress.    Appearance: Normal appearance. She is obese. She is not toxic-appearing.  Cardiovascular:     Rate and Rhythm: Normal rate and regular rhythm.     Pulses: Normal pulses.  Pulmonary:     Effort: Pulmonary effort is normal.     Breath sounds: Normal breath sounds.  Abdominal:     General: There is no distension.     Palpations: Abdomen is soft.     Tenderness: There is no abdominal  tenderness. There is no right CVA tenderness or left CVA tenderness.  Musculoskeletal:        General: Tenderness present.     Comments: Diffuse tenderness to palpation along the lower lumbar spine and bilateral paraspinal muscles.  There is some tenderness noted to the left SI joint space as well.  5 out of 5 motor strength of bilateral lower extremities.  Skin:    General: Skin is warm.     Capillary Refill: Capillary refill takes less than 2 seconds.     Findings: No erythema or rash.  Neurological:     General: No focal deficit present.     Mental Status: She is alert.     Sensory: No sensory deficit.     Motor: No weakness.     ED Results / Procedures / Treatments   Labs (all labs ordered are  listed, but only abnormal results are displayed) Labs Reviewed  URINALYSIS, ROUTINE W REFLEX MICROSCOPIC  PREGNANCY, URINE  D-DIMER, QUANTITATIVE  TROPONIN I (HIGH SENSITIVITY)    EKG None  Radiology DG Chest Portable 1 View  Result Date: 08/17/2022 CLINICAL DATA:  Chest pain x1 week. EXAM: PORTABLE CHEST 1 VIEW COMPARISON:  None Available. FINDINGS: The heart size and mediastinal contours are within normal limits. Both lungs are clear. The visualized skeletal structures are unremarkable. IMPRESSION: No active disease. Electronically Signed   By: Virgina Norfolk M.D.   On: 08/17/2022 19:30    Procedures Procedures    Medications Ordered in ED Medications  ketorolac (TORADOL) injection 60 mg (has no administration in time range)  oxyCODONE-acetaminophen (PERCOCET/ROXICET) 5-325 MG per tablet 1 tablet (has no administration in time range)    ED Course/ Medical Decision Making/ A&P                           Medical Decision Making Patient here with known history of chronic back pain, endorses worsening pain x 1 week.  Pain radiating across her lower back and into both legs.  Pain is worse with certain positions and improves at rest.  No recent fever, chills, numbness or weakness of the lower extremities.  No history of kidney stones.  On my exam, patient has diffuse tenderness of the lumbar paraspinal muscles bilaterally.  No focal neurodeficits.  No saddle anesthesias or red flags suggesting cauda equina. Clinically, I suspect this is likely acute on chronic pain, worsening of her sciatica.  Doubt infectious process or emergent neurological process.  Will check urine, pregnancy and address her pain.  Amount and/or Complexity of Data Reviewed Labs: ordered.    Details: Labs pending.   Radiology: ordered.    Details: Chest x-ray without evidence of acute cardiopulmonary disease. Discussion of management or test interpretation with external provider(s): Patient here with likely  acute on chronic low back pain.  No red flags to suggest cauda equina.  Pain radiates across lower back and into bilateral lower legs.  She does report some left flank pain, no history of kidney stones and she denies any dysuria symptoms.  I feel that it is reasonable to get CT renal stone study for further evaluation along with urinalysis and pregnancy test.  After my initial exam, I was called back into the room by the nursing staff that patient was having left-sided chest pain.  On reexam, patient having some tenderness along the left lateral to lower chest wall underneath her left breast.  Pain reproduced with palpation to this area.  Doubt ACS or PE.  Will get chest x-ray D-dimer and troponin.  Labs pending, EKG reassuring.  Chest x-ray without acute process.  CT renal stone study pending.  Discussed findings with Cherlynn June, PA-C who will review imaging.  I suspect patient was having spasm of her left chest area as pain is improving after position change.  If CT imaging and labs are reassuring, I feel she can be discharged home.  Will provide short course of pain medication and muscle relaxer.  Risk Prescription drug management.           Final Clinical Impression(s) / ED Diagnoses Final diagnoses:  Bilateral low back pain with bilateral sciatica, unspecified chronicity  Nonspecific chest pain    Rx / DC Orders ED Discharge Orders     None         Bufford Lope 08/17/22 1943    Godfrey Pick, MD 08/18/22 [phone]

## 2023-03-21 ENCOUNTER — Emergency Department (HOSPITAL_COMMUNITY)
Admission: EM | Admit: 2023-03-21 | Discharge: 2023-03-21 | Disposition: A | Discharge status: Home / Self Care | Payer: BC Managed Care – PPO | Attending: Emergency Medicine | Admitting: Emergency Medicine

## 2023-03-21 ENCOUNTER — Other Ambulatory Visit: Payer: Self-pay

## 2023-03-21 ENCOUNTER — Encounter (HOSPITAL_COMMUNITY): Payer: Self-pay | Admitting: Emergency Medicine

## 2023-03-21 DIAGNOSIS — M545 Low back pain, unspecified: Secondary | ICD-10-CM | POA: Diagnosis not present

## 2023-03-21 DIAGNOSIS — M5440 Lumbago with sciatica, unspecified side: Secondary | ICD-10-CM | POA: Diagnosis not present

## 2023-03-21 MED ORDER — LIDOCAINE 5 % EX PTCH
1.0000 | MEDICATED_PATCH | CUTANEOUS | Status: DC
  Administered 2023-03-21: 1 via TRANSDERMAL
  Filled 2023-03-21: qty 1

## 2023-03-21 MED ORDER — IBUPROFEN 800 MG PO TABS
800.0000 mg | ORAL_TABLET | Freq: Once | ORAL | Status: AC
  Administered 2023-03-21: 800 mg via ORAL
  Filled 2023-03-21: qty 1

## 2023-03-21 MED ORDER — CYCLOBENZAPRINE HCL 10 MG PO TABS: 10.0000 mg | ORAL_TABLET | Freq: Two times a day (BID) | ORAL | 0 refills | Status: DC | PRN

## 2023-03-21 NOTE — ED Triage Notes (Signed)
Pt via POV c/o 2 days of lower back pain and inability to bend over. Pt denies injury but states she has had difficulty with sciatica for some time.

## 2023-03-21 NOTE — ED Provider Notes (Signed)
[place] EM[facility] DEPARTMENT AT [facility] Provider Note   CSN: [id number] Arrival date & time: 03/21/23  1152     History  Chief Complaint  Patient presents with   Back Pain   HPI Valerie Rasmussen is a 32 y.o. female with h/o sciatica presenting for back pain.  Started 2 days ago.  Send no left lower back primarily in the right side of her back and over her right buttock.  At times it can radiate down her right leg.  Feels like a sharp tingling pain.  Denies saddle anesthesia, fevers, lower extremity weakness or numbness.  Has not taken anything for symptoms.  Endorses a history of sciatica.  Denies urinary or bowel changes.   Back Pain      Home Medications Prior to Admission medications   Medication Sig Start Date End Date Taking? Authorizing Provider  ibuprofen (ADVIL) 800 MG tablet Take 1 tablet (800 mg total) by mouth 3 (three) times daily. Take with food to prevent GI upset 05/28/22   Valentino Nose, NP  methocarbamol (ROBAXIN) 500 MG tablet Take 1 tablet (500 mg total) by mouth 3 (three) times daily. 08/17/22   Triplett, Tammy, PA-C  NIFEdipine (PROCARDIA XL) 30 MG 24 hr tablet Take 1 tablet (30 mg total) by mouth daily. 03/04/22   Arabella Merles, CNM  oxyCODONE-acetaminophen (PERCOCET/ROXICET) 5-325 MG tablet Take 1 tablet by mouth every 4 (four) hours as needed. 08/17/22   Triplett, Babette Relic, PA-C      Allergies    Patient has no known allergies.    Review of Systems   Review of Systems  Musculoskeletal:  Positive for back pain.    Physical Exam Updated Vital Signs BP (!) 130/93   Pulse 86   Temp 98.2 F (36.8 C) (Temporal)   Resp 19   Ht 5\' 7"  (1.702 m)   Wt 117.9 kg   LMP 03/15/2023 (Exact Date)   SpO2 93%   Breastfeeding No   BMI 40.72 kg/m  Physical Exam Constitutional:      Appearance: Normal appearance.  HENT:     Head: Normocephalic.     Nose: Nose normal.  Eyes:     Conjunctiva/sclera: Conjunctivae normal.  Pulmonary:      Effort: Pulmonary effort is normal.  Musculoskeletal:     Lumbar back: Normal range of motion.       Back:     Comments: Range of motion of the lower back appears normal.  Able to ambulate and bear weight.  Neurological:     Mental Status: She is alert.  Psychiatric:        Mood and Affect: Mood normal.     ED Results / Procedures / Treatments   Labs (all labs ordered are listed, but only abnormal results are displayed) Labs Reviewed - No data to display  EKG None  Radiology No results found.  Procedures Procedures    Medications Ordered in ED Medications  ibuprofen (ADVIL) tablet 800 mg (has no administration in time range)  lidocaine (LIDODERM) 5 % 1 patch (has no administration in time range)    ED Course/ Medical Decision Making/ A&P                             Medical Decision Making  32 year old well-appearing female presenting for back pain.  Exam notable for tenderness overlying the right sciatic notch otherwise reassuring.  Symptoms most consistent with sciatica.  No other findings or red flag symptoms to suggest more sinister pathology. Treated with ibuprofen and Lidoderm patch.  Patient requested muscle relaxers which she has tried in the past.  Sent Flexeril to her pharmacy.  Advised to continue NSAIDs and conservative treatment for her back pain.  Advised to follow-up with her PCP.        Final Clinical Impression(s) / ED Diagnoses Final diagnoses:  Low back pain, unspecified back pain laterality, unspecified chronicity, unspecified whether sciatica present    Rx / DC Orders ED Discharge Orders     None         Gareth Eagle, PA-C 03/21/23 1417    Bethann Berkshire, MD 03/22/23 1735

## 2023-03-21 NOTE — Discharge Instructions (Signed)
Evaluation today revealed that you likely do have sciatica.  Recommend ibuprofen for symptomatic relief.  Also recommend you follow-up with your PCP.  Sent Flexeril which is a muscle relaxer to your pharmacy.  Recommend that you take it in the evening as it will make you drowsy.

## 2023-06-15 ENCOUNTER — Encounter (HOSPITAL_COMMUNITY): Payer: Self-pay | Admitting: Emergency Medicine

## 2023-06-15 ENCOUNTER — Emergency Department (HOSPITAL_COMMUNITY)
Admission: EM | Admit: 2023-06-15 | Discharge: 2023-06-15 | Disposition: A | Discharge status: Home / Self Care | Payer: BC Managed Care – PPO | Attending: Emergency Medicine | Admitting: Emergency Medicine

## 2023-06-15 ENCOUNTER — Other Ambulatory Visit: Payer: Self-pay

## 2023-06-15 DIAGNOSIS — M545 Low back pain, unspecified: Secondary | ICD-10-CM | POA: Diagnosis not present

## 2023-06-15 DIAGNOSIS — R110 Nausea: Secondary | ICD-10-CM | POA: Insufficient documentation

## 2023-06-15 DIAGNOSIS — M5417 Radiculopathy, lumbosacral region: Secondary | ICD-10-CM | POA: Diagnosis not present

## 2023-06-15 DIAGNOSIS — R1032 Left lower quadrant pain: Secondary | ICD-10-CM | POA: Insufficient documentation

## 2023-06-15 LAB — BASIC METABOLIC PANEL
Anion gap: 8 (ref 5–15)
BUN: 11 mg/dL (ref 6–20)
CO2: 22 mmol/L (ref 22–32)
Calcium: 8.8 mg/dL — ABNORMAL LOW (ref 8.9–10.3)
Chloride: 106 mmol/L (ref 98–111)
Creatinine, Ser: 0.81 mg/dL (ref 0.44–1.00)
GFR, Estimated: >60 mL/min (ref >60)
Glucose, Bld: 88 mg/dL (ref 70–99)
Potassium: 3.4 mmol/L — ABNORMAL LOW (ref 3.5–5.1)
Sodium: 136 mmol/L (ref 135–145)

## 2023-06-15 LAB — URINALYSIS, ROUTINE W REFLEX MICROSCOPIC
Bilirubin Urine: NEGATIVE
Glucose, UA: NEGATIVE mg/dL
Hgb urine dipstick: NEGATIVE
Ketones, ur: 20 mg/dL — AB
Leukocytes,Ua: NEGATIVE
Nitrite: NEGATIVE
Protein, ur: NEGATIVE mg/dL
Specific Gravity, Urine: 1.021 (ref 1.005–1.030)
pH: 6 (ref 5.0–8.0)

## 2023-06-15 LAB — CBC WITH DIFFERENTIAL/PLATELET
Abs Immature Granulocytes: 0.02 10*3/uL (ref 0.00–0.07)
Basophils Absolute: 0 10*3/uL (ref 0.0–0.1)
Basophils Relative: 0 %
Eosinophils Absolute: 0.1 10*3/uL (ref 0.0–0.5)
Eosinophils Relative: 1 %
HCT: 37.6 % (ref 36.0–46.0)
Hemoglobin: 12.6 g/dL (ref 12.0–15.0)
Immature Granulocytes: 0 %
Lymphocytes Relative: 28 %
Lymphs Abs: 2 10*3/uL (ref 0.7–4.0)
MCH: 31.4 pg (ref 26.0–34.0)
MCHC: 33.5 g/dL (ref 30.0–36.0)
MCV: 93.8 fL (ref 80.0–100.0)
Monocytes Absolute: 0.4 10*3/uL (ref 0.1–1.0)
Monocytes Relative: 5 %
Neutro Abs: 4.9 10*3/uL (ref 1.7–7.7)
Neutrophils Relative %: 66 %
Platelets: 179 10*3/uL (ref 150–400)
RBC: 4.01 MIL/uL (ref 3.87–5.11)
RDW: 12.6 % (ref 11.5–15.5)
WBC: 7.4 10*3/uL (ref 4.0–10.5)
nRBC: 0 % (ref 0.0–0.2)

## 2023-06-15 LAB — PREGNANCY, URINE: Preg Test, Ur: NEGATIVE

## 2023-06-15 MED ORDER — FENTANYL CITRATE PF 50 MCG/ML IJ SOSY
50.0000 ug | PREFILLED_SYRINGE | Freq: Once | INTRAMUSCULAR | Status: AC
  Administered 2023-06-15: 50 ug via INTRAVENOUS
  Filled 2023-06-15: qty 1

## 2023-06-15 MED ORDER — KETOROLAC TROMETHAMINE 15 MG/ML IJ SOLN
15.0000 mg | Freq: Once | INTRAMUSCULAR | Status: AC
  Administered 2023-06-15: 15 mg via INTRAVENOUS
  Filled 2023-06-15: qty 1

## 2023-06-15 NOTE — Discharge Instructions (Signed)

## 2023-06-15 NOTE — ED Triage Notes (Signed)
Pt to ed via pov. Lower Abdominal pain that started yesterday with left sided back pain that has progressively gotten worse. Endorses urinary frequency. Denies fevers, emesis, diarrhea. Had some nausea. Pt ambulatory

## 2023-06-15 NOTE — ED Provider Notes (Signed)
[place] EMERGENCY DEPARTMENT AT [facility] Provider Note   CSN: [id number] Arrival date & time: 06/15/23  4098     History  Chief Complaint  Patient presents with   Abdominal Pain    Valerie Rasmussen is a 32 y.o. female.  The history is provided by the patient.  Abdominal Pain Associated symptoms: nausea   Associated symptoms: no constipation, no diarrhea, no dysuria, no fever and no vomiting    Patient presents for back pain and abdominal pain. Patient reports she started having midline and left lower back pain after doing housework.  She then reports it got worse yesterday now having lower abdominal pain in the left side.  No fevers or vomiting.  She has had the symptoms previously No focal weakness is reported    Home Medications Prior to Admission medications   Medication Sig Start Date End Date Taking? Authorizing Provider  NIFEdipine (PROCARDIA XL) 30 MG 24 hr tablet Take 1 tablet (30 mg total) by mouth daily. 03/04/22   Arabella Merles, CNM      Allergies    Patient has no known allergies.    Review of Systems   Review of Systems  Constitutional:  Negative for fever.  Gastrointestinal:  Positive for abdominal pain and nausea. Negative for blood in stool, constipation, diarrhea and vomiting.  Genitourinary:  Negative for dysuria and frequency.  Musculoskeletal:  Positive for back pain.  Neurological:  Negative for weakness.    Physical Exam Updated Vital Signs BP (!) 156/93 (BP Location: Left Arm)   Pulse 78   Temp (!) 97.4 F (36.3 C) (Oral)   Resp 20   Ht 1.702 m (5\' 7" )   Wt 117.9 kg   LMP 05/30/2023 (Approximate)   SpO2 100%   BMI 40.72 kg/m  Physical Exam CONSTITUTIONAL: Well developed/well nourished, anxious HEAD: Normocephalic/atraumatic EYES: EOMI/PERRL ENMT: Mucous membranes moist NECK: supple no meningeal signs SPINE/BACK:entire spine nontender  No bruising/crepitance/stepoffs noted to spine Left lumbar paraspinal  tenderness CV: S1/S2 noted, no murmurs/rubs/gallops noted LUNGS: Lungs are clear to auscultation bilaterally, no apparent distress ABDOMEN: soft, nontender, no rebound or guarding GU:no cva tenderness NEURO: Awake/alert, equal motor 5/5 strength noted with the following: hip flexion/knee flexion/extension, foot dorsi/plantar flexion, great toe extension intact bilaterally,  no sensory deficit in any dermatome.  EXTREMITIES: pulses normal, full ROM SKIN: warm, color normal PSYCH: Anxious ED Results / Procedures / Treatments   Labs (all labs ordered are listed, but only abnormal results are displayed) Labs Reviewed  URINALYSIS, ROUTINE W REFLEX MICROSCOPIC - Abnormal; Notable for the following components:      Result Value   Ketones, ur 20 (*)    All other components within normal limits  BASIC METABOLIC PANEL - Abnormal; Notable for the following components:   Potassium 3.4 (*)    Calcium 8.8 (*)    All other components within normal limits  PREGNANCY, URINE  CBC WITH DIFFERENTIAL/PLATELET    EKG None  Radiology No results found.  Procedures Procedures    Medications Ordered in ED Medications  ketorolac (TORADOL) 15 MG/ML injection 15 mg (15 mg Intravenous Given 06/15/23 0507)  fentaNYL (SUBLIMAZE) injection 50 mcg (50 mcg Intravenous Given 06/15/23 0507)    ED Course/ Medical Decision Making/ A&P Clinical Course as of 06/15/23 0616  Tue Jun 15, 2023  0615 Overall patient reports feeling improved.  Her labs are unremarkable [DW]  [phone] On reassessment, patient feels that most of her pain was in her back  and at times did radiate into her legs.  Strong suspicion this represents lumbosacral radiculopathy.  Patient had no abdominal tenderness on exam and denies any pain at this time.  Labs reassuring.  She will be discharged home.  No focal weakness is noted. We discussed strict return precautions [DW]    Clinical Course User Index [DW] Zadie Rhine, MD                                  Medical Decision Making Amount and/or Complexity of Data Reviewed Labs: ordered.  Risk Prescription drug management.   This patient presents to the ED for concern of flank pain and back pain, this involves an extensive number of treatment options, and is a complaint that carries with it a high risk of complications and morbidity.  The differential diagnosis includes but is not limited to pyelonephritis, ureteral stone, muscle strain, discitis, epidural abscess, tubo-ovarian abscess, ovarian torsion  Comorbidities that complicate the patient evaluation: Patient's presentation is complicated by their history of hypertension  Additional history obtained: Records reviewed  outpatient records reviewed  Lab Tests: I Ordered, and personally interpreted labs.  The pertinent results include: Labs unremarkable   Medicines ordered and prescription drug management: I ordered medication including fentanyl &Toradol for pain Reevaluation of the patient after these medicines showed that the patient    improved  Test Considered: I considered CT abdomen/pelvis scan, but since patient is improving we will defer at this time   Reevaluation: After the interventions noted above, I reevaluated the patient and found that they have :improved  Complexity of problems addressed: Patient's presentation is most consistent with  acute presentation with potential threat to life or bodily function  Disposition: After consideration of the diagnostic results and the patient's response to treatment,  I feel that the patent would benefit from discharge   .           Final Clinical Impression(s) / ED Diagnoses Final diagnoses:  Lumbosacral radiculopathy    Rx / DC Orders ED Discharge Orders     None         Zadie Rhine, MD 06/15/23 [phone]

## 2024-03-23 ENCOUNTER — Emergency Department (HOSPITAL_COMMUNITY)
Admission: EM | Admit: 2024-03-23 | Discharge: 2024-03-24 | Disposition: A | Discharge status: Home / Self Care | Attending: Emergency Medicine | Admitting: Emergency Medicine

## 2024-03-23 ENCOUNTER — Other Ambulatory Visit: Payer: Self-pay

## 2024-03-23 ENCOUNTER — Encounter (HOSPITAL_COMMUNITY): Payer: Self-pay | Admitting: Emergency Medicine

## 2024-03-23 DIAGNOSIS — W458XXA Other foreign body or object entering through skin, initial encounter: Secondary | ICD-10-CM | POA: Insufficient documentation

## 2024-03-23 DIAGNOSIS — T161XXA Foreign body in right ear, initial encounter: Secondary | ICD-10-CM | POA: Insufficient documentation

## 2024-03-23 NOTE — ED Triage Notes (Signed)
 Pt c/o a bug being in her ear x 30 minutes.

## 2024-03-24 NOTE — ED Provider Notes (Signed)
  [place] EMERGENCY DEPARTMENT AT [facility] Provider Note   CSN: [id number] Arrival date & time: 03/23/24  2208     Patient presents with: Foreign Body in Ear   Valerie Rasmussen is a 33 y.o. female.   Patient is a 33 year old female presenting with a bug in her right ear.  She was leaving work this evening when a bug flew in her ear.  It has been causing irritation and fluttering since.  She tried removing it with peroxide, however it has not come out.       Prior to Admission medications   Medication Sig Start Date End Date Taking? Authorizing Provider  NIFEdipine  (PROCARDIA  XL) 30 MG 24 hr tablet Take 1 tablet (30 mg total) by mouth daily. 03/04/22   Loreli Suzen BIRCH, CNM    Allergies: Patient has no known allergies.    Review of Systems  All other systems reviewed and are negative.   Updated Vital Signs BP 138/80 (BP Location: Right Arm)   Pulse 85   Temp 98.8 F (37.1 C) (Oral)   Resp 18   SpO2 100%   Physical Exam Vitals and nursing note reviewed.  Constitutional:      Appearance: Normal appearance.  HENT:     Ears:     Comments: There is an insect noted in the right ear.  TM clear otherwise. Pulmonary:     Effort: Pulmonary effort is normal.  Neurological:     Mental Status: She is alert and oriented to person, place, and time.     (all labs ordered are listed, but only abnormal results are displayed) Labs Reviewed - No data to display  EKG: None  Radiology: No results found.   Procedures   Medications Ordered in the ED - No data to display                                  Medical Decision Making  The ear was irrigated with saline and bug was removed.  Patient feeling improved.  No residual foreign body.     Final diagnoses:  None    ED Discharge Orders     None          Geroldine Berg, MD 03/24/24 0330

## 2024-03-24 NOTE — Discharge Instructions (Signed)
Return to the ER for any new and/or concerning issues.

## 2024-03-24 NOTE — ED Notes (Signed)
 Pt not wanting to wait for discharge papers.
